# Patient Record
Sex: Female | Born: 1965 | Race: White | Hispanic: No | Marital: Married | Smoking: Never smoker
Health system: Southern US, Community
[De-identification: ages and names within clinical notes are randomized; demographics above are authoritative.]

## PROBLEM LIST (undated history)

## (undated) DIAGNOSIS — I10 Essential (primary) hypertension: Secondary | ICD-10-CM

## (undated) DIAGNOSIS — E2839 Other primary ovarian failure: Secondary | ICD-10-CM

## (undated) HISTORY — PX: TONSILLECTOMY: SUR1361

## (undated) HISTORY — DX: Other primary ovarian failure: E28.39

## (undated) HISTORY — DX: Essential (primary) hypertension: I10

---

## 2008-05-18 ENCOUNTER — Ambulatory Visit: Payer: Self-pay | Admitting: Family Medicine

## 2008-05-18 DIAGNOSIS — I10 Essential (primary) hypertension: Secondary | ICD-10-CM | POA: Insufficient documentation

## 2008-05-20 ENCOUNTER — Encounter: Payer: Self-pay | Admitting: Family Medicine

## 2008-05-23 ENCOUNTER — Encounter: Payer: Self-pay | Admitting: Family Medicine

## 2008-05-23 DIAGNOSIS — E785 Hyperlipidemia, unspecified: Secondary | ICD-10-CM

## 2008-05-23 LAB — CONVERTED CEMR LAB
Albumin: 4.6 g/dL (ref 3.5–5.2)
Alkaline Phosphatase: 88 units/L (ref 39–117)
CO2: 22 meq/L (ref 19–32)
Cholesterol, target level: 200 mg/dL
Cholesterol: 209 mg/dL — ABNORMAL HIGH (ref 0–200)
Glucose, Bld: 118 mg/dL — ABNORMAL HIGH (ref 70–99)
HDL goal, serum: 40 mg/dL
LDL Cholesterol: 122 mg/dL — ABNORMAL HIGH (ref 0–99)
LDL Goal: 160 mg/dL
Potassium: 4.1 meq/L (ref 3.5–5.3)
Sodium: 141 meq/L (ref 135–145)
Total Protein: 8.2 g/dL (ref 6.0–8.3)
Triglycerides: 210 mg/dL — ABNORMAL HIGH (ref <150)

## 2008-07-01 ENCOUNTER — Ambulatory Visit: Payer: Self-pay | Admitting: Family Medicine

## 2009-04-04 ENCOUNTER — Ambulatory Visit: Payer: Self-pay | Admitting: Family Medicine

## 2009-04-04 DIAGNOSIS — R799 Abnormal finding of blood chemistry, unspecified: Secondary | ICD-10-CM

## 2009-05-11 ENCOUNTER — Ambulatory Visit: Payer: Self-pay | Admitting: Family Medicine

## 2009-05-12 LAB — CONVERTED CEMR LAB
Chloride: 102 meq/L (ref 96–112)
Creatinine, Ser: 0.87 mg/dL (ref 0.40–1.20)
TSH: 5.107 microintl units/mL — ABNORMAL HIGH (ref 0.350–4.500)
Triglycerides: 165 mg/dL — ABNORMAL HIGH (ref <150)

## 2009-05-15 ENCOUNTER — Encounter: Payer: Self-pay | Admitting: Family Medicine

## 2009-05-15 LAB — CONVERTED CEMR LAB: Free T4: 1.07 ng/dL (ref 0.80–1.80)

## 2009-11-10 ENCOUNTER — Ambulatory Visit: Payer: Self-pay | Admitting: Family Medicine

## 2010-08-07 ENCOUNTER — Ambulatory Visit: Payer: Self-pay | Admitting: Family Medicine

## 2011-01-22 NOTE — Assessment & Plan Note (Signed)
Summary: F/u on BP meds, lipids   Vital Signs:  Patient profile:   45 year old female Height:      66.5 inches Weight:      242 pounds BMI:     38.61 Pulse rate:   87 / minute BP sitting:   107 / 66  (left arm) Cuff size:   regular  Vitals Entered By: Avon Gully CMA, (AAMA) (August 07, 2010 9:07 AM) CC: F/u BP, Hypertension Management   Primary Care Quintana Canelo:  Linford Arnold, C  CC:  F/u BP and Hypertension Management.  History of Present Illness: Has been waling for exericse. doing well. Taking her meds regularly.   Hypertension History:      She denies headache, chest pain, palpitations, dyspnea with exertion, orthopnea, PND, peripheral edema, visual symptoms, neurologic problems, syncope, and side effects from treatment.  She notes no problems with any antihypertensive medication side effects.  Further comments include: Has been watching the salt in her diet. Marland Kitchen        Positive major cardiovascular risk factors include hyperlipidemia and hypertension.  Negative major cardiovascular risk factors include female age less than 3 years old, no history of diabetes, negative family history for ischemic heart disease, and non-tobacco-user status.        Further assessment for target organ damage reveals no history of ASHD, stroke/TIA, or peripheral vascular disease.     Current Medications (verified): 1)  Lisinopril-Hydrochlorothiazide 20-25 Mg  Tabs (Lisinopril-Hydrochlorothiazide) .... Take 1 Tablet By Mouth Once A Day 2)  Metoprolol Tartrate 25 Mg Tabs (Metoprolol Tartrate) .... Take 1 Tablet By Mouth Two Times A Day 3)  Fish Oil 1000 Mg Caps (Omega-3 Fatty Acids) .... 2 Tabs A Day By Mouth  Allergies (verified): No Known Drug Allergies  Comments:  Nurse/Medical Assistant: The patient's medications and allergies were reviewed with the patient and were updated in the Medication and Allergy Lists. Avon Gully CMA, Duncan Dull) (August 07, 2010 9:08 AM)  Social  History: Reviewed history from 05/18/2008 and no changes required. 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 Married to [employer] with no children.  Never Smoked Alcohol use-no Drug use-no Regular exercise-no  Physical Exam  General:  Well-developed,well-nourished,in no acute distress; alert,appropriate and cooperative throughout examination Head:  Normocephalic and atraumatic without obvious abnormalities. No apparent alopecia or balding. Lungs:  Normal respiratory effort, chest expands symmetrically. Lungs are clear to auscultation, no crackles or wheezes. Heart:  Normal rate and regular rhythm. S1 and S2 normal without gallop, murmur, click, rub or other extra sounds. No carotid bruits.  Extremities:  NO LE edema.  Skin:  no rashes.   Cervical Nodes:  No lymphadenopathy noted Psych:  Cognition and judgment appear intact. Alert and cooperative with normal attention span and concentration. No apparent delusions, illusions, hallucinations   Impression & Recommendations:  Problem # 1:  HYPERTENSION, BENIGN (ICD-401.1) Looks great. Due for labs contnue current meds.  Her updated medication list for this problem includes:    Lisinopril-hydrochlorothiazide 20-25 Mg Tabs (Lisinopril-hydrochlorothiazide) .Marland Kitchen... Take 1 tablet by mouth once a day    Metoprolol Tartrate 25 Mg Tabs (Metoprolol tartrate) .Marland Kitchen... Take 1 tablet by mouth two times a day  BP today: 107/66 Prior BP: 133/77 (11/10/2009)  Prior 10 Yr Risk Heart Disease: 3 % (05/11/2009)  Labs Reviewed: K+: 4.5 (05/11/2009) Creat: : 0.87 (05/11/2009)   Chol: 209 (05/20/2008)   HDL: 45 (05/20/2008)   LDL: 122 (05/20/2008)   TG: 165 (05/11/2009)  Orders: T-Comprehensive  Metabolic Panel ([id number])  Problem # 2:  HYPERLIPIDEMIA (ICD-272.4) Due to recheck Encourage reg exercise and weight loss.  Orders: T-Lipid Profile ([id number])  Complete Medication List: 1)   Lisinopril-hydrochlorothiazide 20-25 Mg Tabs (Lisinopril-hydrochlorothiazide) .... Take 1 tablet by mouth once a day 2)  Metoprolol Tartrate 25 Mg Tabs (Metoprolol tartrate) .... Take 1 tablet by mouth two times a day 3)  Fish Oil 1000 Mg Caps (Omega-3 fatty acids) .... 2 tabs a day by mouth  Hypertension Assessment/Plan:      The patient's hypertensive risk group is category B: At least one risk factor (excluding diabetes) with no target organ damage.  Her calculated 10 year risk of coronary heart disease is 2 %.  Today's blood pressure is 107/66.  Her blood pressure goal is < 140/90.  Patient Instructions: 1)  We will you with your labs.  2)  Please schedule a follow-up appointment in 1 year.  Prescriptions: METOPROLOL TARTRATE 25 MG TABS (METOPROLOL TARTRATE) Take 1 tablet by mouth two times a day  #90 x 3   Entered and Authorized by:   Nani Gasser MD   Signed by:   Nani Gasser MD on 08/07/2010   Method used:   Electronically to        [facility] [phone]* (retail)       [address]       [place], [place]  [address]       Ph: [phone]       Fax: [phone]   RxID:   [id number] LISINOPRIL-HYDROCHLOROTHIAZIDE 20-25 MG  TABS (LISINOPRIL-HYDROCHLOROTHIAZIDE) Take 1 tablet by mouth once a day  #90 x 3   Entered and Authorized by:   Nani Gasser MD   Signed by:   Nani Gasser MD on 08/07/2010   Method used:   Electronically to        [facility] [phone]* (retail)       [address]       [place], [place]  [address]       Ph: [phone]       Fax: [phone]   RxID:   [id number]

## 2011-09-10 ENCOUNTER — Other Ambulatory Visit: Payer: Self-pay | Admitting: Family Medicine

## 2011-09-10 ENCOUNTER — Other Ambulatory Visit: Payer: Self-pay | Admitting: *Deleted

## 2011-09-10 MED ORDER — METOPROLOL TARTRATE 25 MG PO TABS: 25.0000 mg | ORAL_TABLET | Freq: Two times a day (BID) | ORAL | Status: DC

## 2011-09-10 MED ORDER — LISINOPRIL-HYDROCHLOROTHIAZIDE 20-25 MG PO TABS: 1.0000 | ORAL_TABLET | Freq: Every day | ORAL | Status: DC

## 2011-09-10 NOTE — Telephone Encounter (Signed)
Pt called for refills of both her BP medications. Plan:  Reviewed the pt chart and the metoprolol was already sent earlier today.  Will send the lisinopril HCTZ 20-25 for 30 day supply only.  Pt needs BP office visit since it has been over a year since seen for this problem.  Sent to [facility] for # 30/0 refills.

## 2011-09-11 ENCOUNTER — Other Ambulatory Visit: Payer: Self-pay | Admitting: *Deleted

## 2012-05-28 ENCOUNTER — Encounter: Payer: Self-pay | Admitting: *Deleted

## 2012-06-02 ENCOUNTER — Ambulatory Visit (INDEPENDENT_AMBULATORY_CARE_PROVIDER_SITE_OTHER): Payer: 59 | Admitting: Family Medicine

## 2012-06-02 VITALS — BP 118/73 | HR 84 | Wt 241.0 lb

## 2012-06-02 DIAGNOSIS — I10 Essential (primary) hypertension: Secondary | ICD-10-CM

## 2012-06-02 DIAGNOSIS — E785 Hyperlipidemia, unspecified: Secondary | ICD-10-CM

## 2012-06-02 DIAGNOSIS — R109 Unspecified abdominal pain: Secondary | ICD-10-CM

## 2012-06-02 DIAGNOSIS — E669 Obesity, unspecified: Secondary | ICD-10-CM

## 2012-06-02 LAB — CBC WITH DIFFERENTIAL/PLATELET
Basophils Absolute: 0 10*3/uL (ref 0.0–0.1)
Basophils Relative: 0 % (ref 0–1)
Eosinophils Absolute: 0.3 10*3/uL (ref 0.0–0.7)
Eosinophils Relative: 3 % (ref 0–5)
Lymphocytes Relative: 18 % (ref 12–46)
MCH: 29.3 pg (ref 26.0–34.0)
MCHC: 33.3 g/dL (ref 30.0–36.0)
MCV: 87.8 fL (ref 78.0–100.0)
Monocytes Absolute: 0.7 10*3/uL (ref 0.1–1.0)
Platelets: 301 10*3/uL (ref 150–400)
RDW: 13.3 % (ref 11.5–15.5)
WBC: 9.8 10*3/uL (ref 4.0–10.5)

## 2012-06-02 MED ORDER — METOPROLOL TARTRATE 25 MG PO TABS: 25.0000 mg | ORAL_TABLET | Freq: Two times a day (BID) | ORAL | Status: DC

## 2012-06-02 MED ORDER — LISINOPRIL-HYDROCHLOROTHIAZIDE 20-25 MG PO TABS: 1.0000 | ORAL_TABLET | Freq: Every day | ORAL | Status: DC

## 2012-06-02 NOTE — Progress Notes (Signed)
Subjective:    Patient ID: Selena Richard, female    DOB: October 29, 1966, 46 y.o.   MRN: [id number]  HPI HTN - No CP or SOB.  Taking meds regularly.  No SE on the medication. Trying to watch her salt intake.  No regulare xercise.   Abodminal pain intermitttantly. No cramping or nausea.  Did have some hemorrhoids and rectal itching and mucous. Says she felt like she constantly had to clean herself but that has resolved in the last 3 weeks. Will noticed stomach pain diffuse but more often on the right. Usually happens before eatting.  Eating makes it feels better.  No vomiting. Started about 1.5 weeks ago.  Still has gallbladder.   Hyperlipidemia-she still takes artificial daily. We have not checked her blood work since 2009.  Review of Systems BP 118/73  Pulse 84  Wt 241 lb (109.317 kg)  SpO2 99%    No Known Allergies  Past Medical History  Diagnosis Date  . Hypertension   . Primary ovarian failure     Past Surgical History  Procedure Date  . Tonsillectomy     History   Social History  . Marital Status: Married    Spouse Name: N/A    Number of Children: N/A  . Years of Education: N/A   Occupational History  . Not on file.   Social History Main Topics  . Smoking status: Never Smoker   . Smokeless tobacco: Not on file  . Alcohol Use: No  . Drug Use: No  . Sexually Active:    Other Topics Concern  . Not on file   Social History Narrative  . No narrative on file    Family History  Problem Relation Age of Onset  . Hypertension Father     Outpatient Encounter Prescriptions as of 06/02/2012  Medication Sig Dispense Refill  . fish oil-omega-3 fatty acids 1000 MG capsule Take 2 g by mouth 2 (two) times daily.      Marland Kitchen lisinopril-hydrochlorothiazide (PRINZIDE,ZESTORETIC) 20-25 MG per tablet Take 1 tablet by mouth daily.  90 tablet  1  . metoprolol tartrate (LOPRESSOR) 25 MG tablet Take 1 tablet (25 mg total) by mouth 2 (two) times daily.  180 tablet  1  . DISCONTD:  lisinopril-hydrochlorothiazide (PRINZIDE,ZESTORETIC) 20-25 MG per tablet Take 1 tablet by mouth daily.  30 tablet  0  . DISCONTD: metoprolol tartrate (LOPRESSOR) 25 MG tablet Take 1 tablet (25 mg total) by mouth 2 (two) times daily.  180 tablet  1          Objective:   Physical Exam  Constitutional: She is oriented to person, place, and time. She appears well-developed and well-nourished.       + obese   HENT:  Head: Normocephalic and atraumatic.  Eyes: Conjunctivae are normal. Pupils are equal, round, and reactive to light.  Neck: Neck supple. No thyromegaly present.  Cardiovascular: Normal rate, regular rhythm and normal heart sounds.        No carotid bruits.  No abdomina bruits.   Pulmonary/Chest: Effort normal and breath sounds normal.  Abdominal: Soft. Bowel sounds are normal. She exhibits no distension and no mass. There is no tenderness. There is no rebound and no guarding.  Musculoskeletal: She exhibits no edema.  Lymphadenopathy:    She has no cervical adenopathy.  Neurological: She is alert and oriented to person, place, and time.  Skin: Skin is warm and dry.  Psychiatric: She has a normal mood and affect. Her behavior is  normal.          Assessment & Plan:  HTN - Well controlled.  CheckCMP. RF sent. F/U in 6 months.    Abdominal pain - Consider GERD, vs ulcer. Pain is focused in the LUQ primarly but no other GB symptoms.  Will check liver and pancreas ienzymes. If labs are normal then rec trial ofPrilosec OTC for 2 weeks to see if helps or not.    Hyperlipidemia - REcheck lipid today.   Lab Results  Component Value Date   CHOL 209* 05/20/2008   HDL 45 05/20/2008   LDLCALC 122* 05/20/2008   TRIG 165* 05/11/2009   CHOLHDL 4.6 Ratio 05/20/2008   Obese - Discussed getting regular exercise and working on diet.

## 2012-06-03 LAB — COMPLETE METABOLIC PANEL WITH GFR
Alkaline Phosphatase: 83 U/L (ref 39–117)
BUN: 20 mg/dL (ref 6–23)
GFR, Est Non African American: 83 mL/min
Glucose, Bld: 107 mg/dL — ABNORMAL HIGH (ref 70–99)
Total Bilirubin: 0.5 mg/dL (ref 0.3–1.2)

## 2012-06-03 LAB — LIPASE: Lipase: 24 U/L (ref 0–75)

## 2012-06-03 LAB — LIPID PANEL
Cholesterol: 206 mg/dL — ABNORMAL HIGH (ref 0–200)
HDL: 41 mg/dL (ref >39)
Total CHOL/HDL Ratio: 5 Ratio

## 2013-02-22 ENCOUNTER — Encounter: Payer: Self-pay | Admitting: Family Medicine

## 2013-02-22 ENCOUNTER — Ambulatory Visit (INDEPENDENT_AMBULATORY_CARE_PROVIDER_SITE_OTHER): Payer: 59 | Admitting: Family Medicine

## 2013-02-22 VITALS — BP 133/80 | HR 76 | Ht 66.0 in | Wt 238.0 lb

## 2013-02-22 DIAGNOSIS — I10 Essential (primary) hypertension: Secondary | ICD-10-CM

## 2013-02-22 DIAGNOSIS — R7301 Impaired fasting glucose: Secondary | ICD-10-CM

## 2013-02-22 DIAGNOSIS — E785 Hyperlipidemia, unspecified: Secondary | ICD-10-CM

## 2013-02-22 DIAGNOSIS — R7302 Impaired glucose tolerance (oral): Secondary | ICD-10-CM

## 2013-02-22 MED ORDER — LISINOPRIL-HYDROCHLOROTHIAZIDE 20-25 MG PO TABS: 1.0000 | ORAL_TABLET | Freq: Every day | ORAL | Status: DC

## 2013-02-22 MED ORDER — METOPROLOL TARTRATE 25 MG PO TABS: 25.0000 mg | ORAL_TABLET | Freq: Two times a day (BID) | ORAL | Status: DC

## 2013-02-22 NOTE — Patient Instructions (Signed)

## 2013-02-22 NOTE — Progress Notes (Signed)
  Subjective:    Patient ID: Selena Richard, female    DOB: [birth year], 47 y.o.   MRN: [id number]  HPI HTN -  Pt denies chest pain, SOB, dizziness, or heart palpitations.  Taking meds as directed w/o problems.  Denies medication side effects. Some exercise.    Abnormal glucose - Due to check A1C today for f/u. Sugar has been borderline the last 3 times.  She denies any polyuria or polydipsia.  Hyperlipidemia-she has been taking her fish oil. No side effects or problems.  Review of Systems     Objective:   Physical Exam  Constitutional: She is oriented to person, place, and time. She appears well-developed and well-nourished.  HENT:  Head: Normocephalic and atraumatic.  Eyes: Conjunctivae and EOM are normal. Pupils are equal, round, and reactive to light.  Neck: Neck supple. No thyromegaly present.  Cardiovascular: Normal rate, regular rhythm and normal heart sounds.   Pulmonary/Chest: Effort normal and breath sounds normal. She has no wheezes.  Lymphadenopathy:    She has no cervical adenopathy.  Neurological: She is alert and oriented to person, place, and time.  Skin: Skin is warm and dry.  Psychiatric: She has a normal mood and affect.          Assessment & Plan:  HTN - Well controlled.  Reminded her about low sat diet.  Work on regular exercise. Followup in 6 months.  IFG - A1C today 5.7.  Thus she definition for impaired fasting glucose.  Discussed with this means exactly and what the diagnosis in details.Discussed working on avoiding concentrated sweets and eating a low carb diet. Work on trying to strike a regular exercise, 30 minutes 5 days per week. Follow up in 6 months to recheck.   hyperlpidemia -  Due to recheck levels.  Given labslip today.

## 2013-11-03 ENCOUNTER — Other Ambulatory Visit: Payer: Self-pay | Admitting: Family Medicine

## 2013-11-09 ENCOUNTER — Ambulatory Visit (INDEPENDENT_AMBULATORY_CARE_PROVIDER_SITE_OTHER): Payer: 59 | Admitting: Family Medicine

## 2013-11-09 ENCOUNTER — Encounter: Payer: Self-pay | Admitting: Family Medicine

## 2013-11-09 VITALS — BP 117/74 | HR 70 | Temp 96.7°F | Wt 230.0 lb

## 2013-11-09 DIAGNOSIS — I10 Essential (primary) hypertension: Secondary | ICD-10-CM

## 2013-11-09 DIAGNOSIS — E785 Hyperlipidemia, unspecified: Secondary | ICD-10-CM

## 2013-11-09 DIAGNOSIS — R7301 Impaired fasting glucose: Secondary | ICD-10-CM

## 2013-11-09 LAB — POCT GLYCOSYLATED HEMOGLOBIN (HGB A1C): Hemoglobin A1C: 5.6

## 2013-11-09 MED ORDER — METOPROLOL TARTRATE 25 MG PO TABS: 25.0000 mg | ORAL_TABLET | Freq: Two times a day (BID) | ORAL | Status: DC

## 2013-11-09 MED ORDER — LISINOPRIL-HYDROCHLOROTHIAZIDE 20-25 MG PO TABS: ORAL_TABLET | ORAL | Status: DC

## 2013-11-09 NOTE — Progress Notes (Signed)
  Subjective:    Patient ID: Selena Richard, female    DOB: June 03, 1966, 47 y.o.   MRN: [id number]  HPI HTN-  Pt denies chest pain, SOB, dizziness, or heart palpitations.  Taking meds as directed w/o problems.  Denies medication side effects.     IFG-  No inc thirst or urination. Has lost 8 lbs.   Lab Results  Component Value Date   HGBA1C 5.7 02/22/2013    Hyperlipidemia- Due for lipids.  No other supplements.    Review of Systems \ BP 117/74  Pulse 70  Temp(Src) 96.7 F (35.9 C)  Wt 230 lb (104.327 kg)    No Known Allergies  Past Medical History  Diagnosis Date  . Hypertension   . Primary ovarian failure     Past Surgical History  Procedure Laterality Date  . Tonsillectomy      History   Social History  . Marital Status: Married    Spouse Name: N/A    Number of Children: N/A  . Years of Education: N/A   Occupational History  . Not on file.   Social History Main Topics  . Smoking status: Never Smoker   . Smokeless tobacco: Not on file  . Alcohol Use: No  . Drug Use: No  . Sexual Activity:    Other Topics Concern  . Not on file   Social History Narrative  . No narrative on file    Family History  Problem Relation Age of Onset  . Hypertension Father     Outpatient Encounter Prescriptions as of 11/09/2013  Medication Sig  . lisinopril-hydrochlorothiazide (PRINZIDE,ZESTORETIC) 20-25 MG per tablet Take one tablet by mouth one time daily  . metoprolol tartrate (LOPRESSOR) 25 MG tablet Take 1 tablet (25 mg total) by mouth 2 (two) times daily.  . [DISCONTINUED] lisinopril-hydrochlorothiazide (PRINZIDE,ZESTORETIC) 20-25 MG per tablet Take one tablet by mouth one time daily  . [DISCONTINUED] metoprolol tartrate (LOPRESSOR) 25 MG tablet Take 1 tablet (25 mg total) by mouth 2 (two) times daily.  . [DISCONTINUED] fish oil-omega-3 fatty acids 1000 MG capsule Take 2 g by mouth 2 (two) times daily.       Objective:   Physical Exam  Constitutional: She is  oriented to person, place, and time. She appears well-developed and well-nourished.  HENT:  Head: Normocephalic and atraumatic.  Cardiovascular: Normal rate, regular rhythm and normal heart sounds.   Pulmonary/Chest: Effort normal and breath sounds normal.  Musculoskeletal: She exhibits no edema.  Neurological: She is alert and oriented to person, place, and time.  Skin: Skin is warm and dry.  Psychiatric: She has a normal mood and affect. Her behavior is normal.          Assessment & Plan:  HTN- Well controlled. F/U in 6 months.   IFG - Great jobs A1C is down to 5.6  F/U in 6 months.    Hyperlipidemia - Due to check today. Lipids ordered.    Declined flu vaccine.

## 2013-11-09 NOTE — Addendum Note (Signed)
Addended by: Deno Etienne on: 11/09/2013 08:49 AM   Modules accepted: Orders

## 2013-11-09 NOTE — Patient Instructions (Signed)
Keep up the good work!!!!   Keep up a regular exercise program and make sure you are eating a healthy diet Try to eat 4 servings of dairy a day, or if you are lactose intolerant take a calcium with vitamin D daily.  Your vaccines are up to date.

## 2015-01-27 ENCOUNTER — Ambulatory Visit (INDEPENDENT_AMBULATORY_CARE_PROVIDER_SITE_OTHER): Payer: 59 | Admitting: Family Medicine

## 2015-01-27 ENCOUNTER — Encounter: Payer: Self-pay | Admitting: Family Medicine

## 2015-01-27 VITALS — BP 107/67 | HR 77 | Ht 66.0 in | Wt 240.0 lb

## 2015-01-27 DIAGNOSIS — E785 Hyperlipidemia, unspecified: Secondary | ICD-10-CM

## 2015-01-27 DIAGNOSIS — Z114 Encounter for screening for human immunodeficiency virus [HIV]: Secondary | ICD-10-CM

## 2015-01-27 DIAGNOSIS — I10 Essential (primary) hypertension: Secondary | ICD-10-CM

## 2015-01-27 DIAGNOSIS — R7301 Impaired fasting glucose: Secondary | ICD-10-CM

## 2015-01-27 DIAGNOSIS — Z6838 Body mass index (BMI) 38.0-38.9, adult: Secondary | ICD-10-CM

## 2015-01-27 LAB — POCT GLYCOSYLATED HEMOGLOBIN (HGB A1C): HEMOGLOBIN A1C: 5.8

## 2015-01-27 MED ORDER — LISINOPRIL-HYDROCHLOROTHIAZIDE 20-25 MG PO TABS: ORAL_TABLET | ORAL | Status: DC

## 2015-01-27 MED ORDER — METOPROLOL TARTRATE 25 MG PO TABS: 25.0000 mg | ORAL_TABLET | Freq: Two times a day (BID) | ORAL | Status: DC

## 2015-01-27 NOTE — Patient Instructions (Signed)
Liraglutide injection (Weight Management) What is this medicine? LIRAGLUTIDE (LIR a GLOO tide) is used with a reduced calorie diet and exercise to help you lose weight. This medicine may be used for other purposes; ask your health care provider or pharmacist if you have questions. COMMON BRAND NAME(S): Saxenda What should I tell my health care provider before I take this medicine? They need to know if you have any of these conditions: -endocrine tumors (MEN 2) or if someone in your family had these tumors -gallstones -high cholesterol -history of alcohol abuse problem -history of pancreatitis -kidney disease or if you are on dialysis -liver disease -previous swelling of the tongue, face, or lips with difficulty breathing, difficulty swallowing, hoarseness, or tightening of the throat -stomach problems -suicidal thoughts, plans, or attempt; a previous suicide attempt by you or a family member -thyroid cancer or if someone in your family had thyroid cancer -an unusual or allergic reaction to liraglutide, medicines, foods, dyes, or preservatives -pregnant or trying to get pregnant -breast-feeding How should I use this medicine? This medicine is for injection under the skin of your upper leg, stomach area, or upper arm. You will be taught how to prepare and give this medicine. Use exactly as directed. Take your medicine at regular intervals. Do not take it more often than directed. It is important that you put your used needles and syringes in a special sharps container. Do not put them in a trash can. If you do not have a sharps container, call your pharmacist or healthcare provider to get one. A special MedGuide will be given to you by the pharmacist with each prescription and refill. Be sure to read this information carefully each time. Talk to your pediatrician regarding the use of this medicine in children. Special care may be needed. Overdosage: If you think you've taken too much of this  medicine contact a poison control center or emergency room at once. Overdosage: If you think you have taken too much of this medicine contact a poison control center or emergency room at once. NOTE: This medicine is only for you. Do not share this medicine with others. What if I miss a dose? If you miss a dose, take it as soon as you can. If it is almost time for your next dose, take only that dose. Do not take double or extra doses. If you miss your dose for 3 days or more, call your doctor or health care professional to talk about how to restart this medicine. What may interact with this medicine? -acetaminophen -atorvastatin -birth control pills -digoxin -griseofulvin -lisinopril This list may not describe all possible interactions. Give your health care provider a list of all the medicines, herbs, non-prescription drugs, or dietary supplements you use. Also tell them if you smoke, drink alcohol, or use illegal drugs. Some items may interact with your medicine. What should I watch for while using this medicine? Visit your doctor or health care professional for regular checks on your progress. This medicine is intended to be used in addition to a healthy diet and appropriate exercise. The best results are achieved this way. Do not increase or in any way change your dose without consulting your doctor or health care professional. This medicine may affect blood sugar levels. If you have diabetes, check with your doctor or health care professional before you change your diet or the dose of your diabetic medicine. Patients and their families should watch out for worsening depression or thoughts of suicide. Also watch   out for sudden changes in feelings such as feeling anxious, agitated, panicky, irritable, hostile, aggressive, impulsive, severely restless, overly excited and hyperactive, or not being able to sleep. If this happens, especially at the beginning of treatment or after a change in dose, call  your health care professional. What side effects may I notice from receiving this medicine? Side effects that you should report to your doctor or health care professional as soon as possible: -allergic reactions like skin rash, itching or hives, swelling of the face, lips, or tongue -breathing problems -fever, chills -loss of appetite -signs and symptoms of low blood sugar such as feeling anxious, confusion, dizziness, increased hunger, unusually weak or tired, sweating, shakiness, cold, irritable, headache, blurred vision, fast heartbeat, loss of consciousness -trouble passing urine or change in the amount of urine -unusual stomach pain or upset -vomiting Side effects that usually do not require medical attention (Report these to your doctor or health care professional if they continue or are bothersome.): -constipation -diarrhea -fatigue -headache -nausea This list may not describe all possible side effects. Call your doctor for medical advice about side effects. You may report side effects to FDA at 1-800-FDA-1088. Where should I keep my medicine? Keep out of the reach of children. Store unopened pen in a refrigerator between 2 and 8 degrees C (36 and 46 degrees F). Do not freeze or use if the medicine has been frozen. Protect from light and excessive heat. After you first use the pen, it can be stored at room temperature between 15 and 30 degrees C (59 and 86 degrees F) or in a refrigerator. Throw away your used pen after 30 days or after the expiration date, whichever comes first. Do not store your pen with the needle attached. If the needle is left on, medicine may leak from the pen. NOTE: This sheet is a summary. It may not cover all possible information. If you have questions about this medicine, talk to your doctor, pharmacist, or health care provider.  2015, Elsevier/Gold Standard. (2014-02-03 12:29:49)  

## 2015-01-27 NOTE — Progress Notes (Signed)
   Subjective:    Patient ID: Selena NewcomerPatricia A Richard, female    DOB: 06/02/1966, 49 y.o.   MRN: [id number]  HPI Hypertension- Pt denies chest pain, SOB, dizziness, or heart palpitations.  Taking meds as directed w/o problems.  Denies medication side effects.    IFG - She says not change in her diet or exercise. No increased thirst or urination.   Review of Systems     Objective:   Physical Exam  Constitutional: She is oriented to person, place, and time. She appears well-developed and well-nourished.  HENT:  Head: Normocephalic and atraumatic.  Cardiovascular: Normal rate, regular rhythm and normal heart sounds.   Pulmonary/Chest: Effort normal and breath sounds normal.  Neurological: She is alert and oriented to person, place, and time.  Skin: Skin is warm and dry.  Psychiatric: She has a normal mood and affect. Her behavior is normal.          Assessment & Plan:  HTN - well controlled. F/U in 6 months.    IFG - A1C is 5.8 today.  A1c is up from previous. Discussed importance of diet and exercise and getting back on track.  BMI 38 - we discussed options including metformin versus Sexenda. I think with her impaired fasting glucose one of these agents would be very helpful for her to help lower her glucose as well as help reduce her weight. She has gained some weight since I last saw her. She wants to really work on diet and exercise on her own over the next 3 months. And then think about the other options as well.   Follow-up in 6 months.

## 2015-05-01 ENCOUNTER — Ambulatory Visit (INDEPENDENT_AMBULATORY_CARE_PROVIDER_SITE_OTHER): Payer: 59 | Admitting: Family Medicine

## 2015-05-01 ENCOUNTER — Encounter: Payer: Self-pay | Admitting: Family Medicine

## 2015-05-01 VITALS — BP 118/79 | HR 74 | Wt 238.0 lb

## 2015-05-01 DIAGNOSIS — Q969 Turner's syndrome, unspecified: Secondary | ICD-10-CM

## 2015-05-01 DIAGNOSIS — R7301 Impaired fasting glucose: Secondary | ICD-10-CM

## 2015-05-01 DIAGNOSIS — Z6838 Body mass index (BMI) 38.0-38.9, adult: Secondary | ICD-10-CM

## 2015-05-01 LAB — POCT GLYCOSYLATED HEMOGLOBIN (HGB A1C): Hemoglobin A1C: 5.9

## 2015-05-01 NOTE — Patient Instructions (Signed)
Corcidan and Mucinex.

## 2015-05-01 NOTE — Progress Notes (Signed)
   Subjective:    Patient ID: Selena Richard, female    DOB: 03/22/1966, 49 y.o.   MRN: [id number]  HPI Here for f/u IFG.  She had also gained some weight last time she was here. Has lot 2 lbs since last here. Has been watching dietary choices more.  Cut back on sugars and carbs. No wounds that aren't healing.    [place]  Her last pap was 7 years ago. Would like to see gyn and get estab.    Review of Systems     Objective:   Physical Exam  Constitutional: She is oriented to person, place, and time. She appears well-developed and well-nourished.  HENT:  Head: Normocephalic and atraumatic.  Cardiovascular: Normal rate, regular rhythm and normal heart sounds.   Pulmonary/Chest: Effort normal and breath sounds normal.  Neurological: She is alert and oriented to person, place, and time.  Skin: Skin is warm and dry.  Psychiatric: She has a normal mood and affect. Her behavior is normal.          Assessment & Plan:  IFG - repeat A1C is 5.9.  Has made some good changes with lifestyle.    BMI 38. Has lost 2 lb. Continue to work on weight loss.   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

## 2015-09-01 ENCOUNTER — Ambulatory Visit: Payer: 59 | Admitting: Family Medicine

## 2015-09-04 ENCOUNTER — Ambulatory Visit (INDEPENDENT_AMBULATORY_CARE_PROVIDER_SITE_OTHER): Payer: 59 | Admitting: Family Medicine

## 2015-09-04 ENCOUNTER — Encounter: Payer: Self-pay | Admitting: Family Medicine

## 2015-09-04 VITALS — BP 127/75 | HR 70 | Wt 241.0 lb

## 2015-09-04 DIAGNOSIS — Z6838 Body mass index (BMI) 38.0-38.9, adult: Secondary | ICD-10-CM

## 2015-09-04 DIAGNOSIS — R7301 Impaired fasting glucose: Secondary | ICD-10-CM | POA: Diagnosis not present

## 2015-09-04 DIAGNOSIS — E785 Hyperlipidemia, unspecified: Secondary | ICD-10-CM | POA: Diagnosis not present

## 2015-09-04 DIAGNOSIS — I10 Essential (primary) hypertension: Secondary | ICD-10-CM

## 2015-09-04 DIAGNOSIS — Z114 Encounter for screening for human immunodeficiency virus [HIV]: Secondary | ICD-10-CM | POA: Diagnosis not present

## 2015-09-04 LAB — POCT GLYCOSYLATED HEMOGLOBIN (HGB A1C): HEMOGLOBIN A1C: 5.8

## 2015-09-04 MED ORDER — METOPROLOL TARTRATE 25 MG PO TABS: 25.0000 mg | ORAL_TABLET | Freq: Two times a day (BID) | ORAL | Status: DC

## 2015-09-04 MED ORDER — LISINOPRIL-HYDROCHLOROTHIAZIDE 20-25 MG PO TABS: ORAL_TABLET | ORAL | Status: DC

## 2015-09-04 NOTE — Progress Notes (Signed)
   Subjective:    Patient ID: Selena Richard, female    DOB: 05/23/1966, 49 y.o.   MRN: [id number]  HPI Hypertension- Pt denies chest pain, SOB, dizziness, or heart palpitations.  Taking meds as directed w/o problems.  Denies medication side effects.  She has gained about 3 lbs.    IFG - No regular exercise.  No inc thrist or urination.    Review of Systems     Objective:   Physical Exam  Constitutional: She is oriented to person, place, and time. She appears well-developed and well-nourished.  HENT:  Head: Normocephalic and atraumatic.  Cardiovascular: Normal rate, regular rhythm and normal heart sounds.   Pulmonary/Chest: Effort normal and breath sounds normal.  Neurological: She is alert and oriented to person, place, and time.  Skin: Skin is warm and dry.  Psychiatric: She has a normal mood and affect. Her behavior is normal.        Assessment & Plan:  HTN - well controlled. Due for CMP and lipids.  F/u in 6 months.   IFG - well controlled at 5.8.    BMI 38 - has not been exercising. Has gained a few pounds back. Discussed getting more active.

## 2016-07-12 ENCOUNTER — Other Ambulatory Visit: Payer: Self-pay | Admitting: Family Medicine

## 2016-07-17 ENCOUNTER — Telehealth: Payer: Self-pay | Admitting: Family Medicine

## 2016-07-17 NOTE — Telephone Encounter (Signed)
I called Selena Richard to inform her she needed to make a follow up appointment for her BP and IFG refills. She scheduled for 8/7 at 10:00 am. - CF

## 2016-07-29 ENCOUNTER — Ambulatory Visit: Payer: 59 | Admitting: Family Medicine

## 2016-08-12 ENCOUNTER — Other Ambulatory Visit: Payer: Self-pay | Admitting: Family Medicine

## 2016-08-12 ENCOUNTER — Ambulatory Visit: Payer: 59 | Admitting: Family Medicine

## 2016-08-19 ENCOUNTER — Ambulatory Visit: Payer: 59 | Admitting: Family Medicine

## 2016-10-24 ENCOUNTER — Ambulatory Visit (INDEPENDENT_AMBULATORY_CARE_PROVIDER_SITE_OTHER): Payer: Managed Care, Other (non HMO) | Admitting: Family Medicine

## 2016-10-24 ENCOUNTER — Encounter: Payer: Self-pay | Admitting: Family Medicine

## 2016-10-24 VITALS — BP 113/64 | HR 65 | Wt 237.0 lb

## 2016-10-24 DIAGNOSIS — E785 Hyperlipidemia, unspecified: Secondary | ICD-10-CM | POA: Diagnosis not present

## 2016-10-24 DIAGNOSIS — I10 Essential (primary) hypertension: Secondary | ICD-10-CM | POA: Diagnosis not present

## 2016-10-24 DIAGNOSIS — R7301 Impaired fasting glucose: Secondary | ICD-10-CM

## 2016-10-24 DIAGNOSIS — Z1231 Encounter for screening mammogram for malignant neoplasm of breast: Secondary | ICD-10-CM | POA: Diagnosis not present

## 2016-10-24 LAB — POCT GLYCOSYLATED HEMOGLOBIN (HGB A1C): Hemoglobin A1C: 5.7

## 2016-10-24 MED ORDER — LISINOPRIL-HYDROCHLOROTHIAZIDE 20-25 MG PO TABS: 1.0000 | ORAL_TABLET | Freq: Every day | ORAL | 1 refills | Status: DC

## 2016-10-24 MED ORDER — METOPROLOL TARTRATE 25 MG PO TABS: ORAL_TABLET | ORAL | 1 refills | Status: DC

## 2016-10-24 NOTE — Progress Notes (Signed)
Subjective:    CC:   HPI: Hypertension- Pt denies chest pain, SOB, dizziness, or heart palpitations.  Taking meds as directed w/o problems.  Denies medication side effects.    IFG -  No inc thirst or urination.   Past medical history, Surgical history, Family history not pertinant except as noted below, Social history, Allergies, and medications have been entered into the medical record, reviewed, and corrections made.   Review of Systems: No fevers, chills, night sweats, weight loss, chest pain, or shortness of breath.   Objective:    General: Well Developed, well nourished, and in no acute distress.  Neuro: Alert and oriented x3, extra-ocular muscles intact, sensation grossly intact.  HEENT: Normocephalic, atraumatic  Skin: Warm and dry, no rashes. Cardiac: Regular rate and rhythm, no murmurs rubs or gallops, no lower extremity edema.  Respiratory: Clear to auscultation bilaterally. Not using accessory muscles, speaking in full sentences.   Impression and Recommendations:    HTN - Well controlled. Continue current regimen. Follow up in  6months.  Due for CMP and lipids.   IFG - stable.  A1C of 5.7.    Discussed need for Pap smear and mammogram. Will place order for her mammograms that she can have an downstairs. Encouraged her to schedule her physical with Pap smear as soon as possible. She is 8 years overdue.

## 2016-10-25 LAB — COMPLETE METABOLIC PANEL WITH GFR
ALBUMIN: 4 g/dL (ref 3.6–5.1)
ALK PHOS: 81 U/L (ref 33–115)
ALT: 29 U/L (ref 6–29)
AST: 22 U/L (ref 10–35)
BUN: 17 mg/dL (ref 7–25)
CO2: 21 mmol/L (ref 20–31)
Calcium: 9.5 mg/dL (ref 8.6–10.2)
Chloride: 103 mmol/L (ref 98–110)
Creat: 0.92 mg/dL (ref 0.50–1.10)
GFR, Est African American: 85 mL/min (ref >=60)
GFR, Est Non African American: 74 mL/min (ref >=60)
GLUCOSE: 104 mg/dL — AB (ref 65–99)
POTASSIUM: 4.4 mmol/L (ref 3.5–5.3)
SODIUM: 137 mmol/L (ref 135–146)
Total Bilirubin: 0.5 mg/dL (ref 0.2–1.2)
Total Protein: 7.3 g/dL (ref 6.1–8.1)

## 2016-10-25 LAB — LIPID PANEL
CHOL/HDL RATIO: 5.6 ratio — AB (ref <=5.0)
Cholesterol: 195 mg/dL (ref 125–200)
HDL: 35 mg/dL — ABNORMAL LOW (ref >=46)
LDL CALC: 117 mg/dL (ref <130)
Triglycerides: 213 mg/dL — ABNORMAL HIGH (ref <150)
VLDL: 43 mg/dL — AB (ref <30)

## 2017-01-23 ENCOUNTER — Other Ambulatory Visit: Payer: Self-pay | Admitting: Family Medicine

## 2017-03-21 ENCOUNTER — Other Ambulatory Visit: Payer: Self-pay | Admitting: Family Medicine

## 2017-04-23 ENCOUNTER — Encounter: Payer: 59 | Admitting: Family Medicine

## 2017-04-25 ENCOUNTER — Other Ambulatory Visit: Payer: Self-pay | Admitting: Family Medicine

## 2017-05-23 ENCOUNTER — Other Ambulatory Visit: Payer: Self-pay | Admitting: Family Medicine

## 2017-06-03 ENCOUNTER — Telehealth: Payer: Self-pay | Admitting: Family Medicine

## 2017-06-03 NOTE — Telephone Encounter (Signed)
Left a message for patient to call and schedule a BP f/u appt with Dr.Metheney

## 2017-07-28 ENCOUNTER — Other Ambulatory Visit: Payer: Self-pay | Admitting: Family Medicine

## 2017-08-21 ENCOUNTER — Ambulatory Visit (INDEPENDENT_AMBULATORY_CARE_PROVIDER_SITE_OTHER): Payer: 59 | Admitting: Family Medicine

## 2017-08-21 ENCOUNTER — Encounter: Payer: Self-pay | Admitting: Family Medicine

## 2017-08-21 VITALS — BP 120/62 | HR 67 | Wt 242.0 lb

## 2017-08-21 DIAGNOSIS — I10 Essential (primary) hypertension: Secondary | ICD-10-CM | POA: Diagnosis not present

## 2017-08-21 DIAGNOSIS — Z1231 Encounter for screening mammogram for malignant neoplasm of breast: Secondary | ICD-10-CM

## 2017-08-21 DIAGNOSIS — R7301 Impaired fasting glucose: Secondary | ICD-10-CM

## 2017-08-21 LAB — POCT GLYCOSYLATED HEMOGLOBIN (HGB A1C): Hemoglobin A1C: 5.8

## 2017-08-21 MED ORDER — LISINOPRIL-HYDROCHLOROTHIAZIDE 20-25 MG PO TABS: 1.0000 | ORAL_TABLET | Freq: Every day | ORAL | 1 refills | Status: DC

## 2017-08-21 MED ORDER — METOPROLOL TARTRATE 25 MG PO TABS: ORAL_TABLET | ORAL | 1 refills | Status: DC

## 2017-08-21 NOTE — Patient Instructions (Signed)
Please schedule your Pap smear. You can either do that with us or with her GYN group down the hall.

## 2017-08-21 NOTE — Progress Notes (Signed)
Subjective:    CC: BP, IFG   HPI:  Hypertension- Pt denies chest pain, SOB, dizziness, or heart palpitations.  Taking meds as directed w/o problems.  Denies medication side effects.    Impaired fasting glucose-no increased thirst or urination. No symptoms consistent with hypoglycemia.   Past medical history, Surgical history, Family history not pertinant except as noted below, Social history, Allergies, and medications have been entered into the medical record, reviewed, and corrections made.   Review of Systems: No fevers, chills, night sweats, weight loss, chest pain, or shortness of breath.   Objective:    General: Well Developed, well nourished, and in no acute distress.  Neuro: Alert and oriented x3, extra-ocular muscles intact, sensation grossly intact.  HEENT: Normocephalic, atraumatic  Skin: Warm and dry, no rashes. Cardiac: Regular rate and rhythm, no murmurs rubs or gallops, no lower extremity edema.  Respiratory: Clear to auscultation bilaterally. Not using accessory muscles, speaking in full sentences.   Impression and Recommendations:    HTN  - Well controlled. Continue current regimen. Follow up in  6 months. Due for CMP and lipid panel in November.  IFG - Well controlled. A1c 5.8 today.Continue current regimen. Follow up in  6 months.  Lab Results  Component Value Date   HGBA1C 5.8 08/21/2017     She is overdue for her Pap smear.encouraged her to schedule physical with Pap this fall.  Ordered her mammogram as well.

## 2017-09-04 ENCOUNTER — Other Ambulatory Visit: Payer: Self-pay | Admitting: Family Medicine

## 2018-02-19 ENCOUNTER — Ambulatory Visit: Payer: 59 | Admitting: Family Medicine

## 2018-03-05 ENCOUNTER — Ambulatory Visit: Payer: 59 | Admitting: Family Medicine

## 2018-04-11 ENCOUNTER — Other Ambulatory Visit: Payer: Self-pay | Admitting: Family Medicine

## 2018-08-13 ENCOUNTER — Encounter: Payer: Self-pay | Admitting: Family Medicine

## 2018-08-13 ENCOUNTER — Ambulatory Visit (INDEPENDENT_AMBULATORY_CARE_PROVIDER_SITE_OTHER): Payer: 59 | Admitting: Family Medicine

## 2018-08-13 VITALS — BP 125/66 | HR 66 | Ht 66.0 in | Wt 241.0 lb

## 2018-08-13 DIAGNOSIS — Z1231 Encounter for screening mammogram for malignant neoplasm of breast: Secondary | ICD-10-CM

## 2018-08-13 DIAGNOSIS — R7301 Impaired fasting glucose: Secondary | ICD-10-CM

## 2018-08-13 DIAGNOSIS — I10 Essential (primary) hypertension: Secondary | ICD-10-CM

## 2018-08-13 LAB — POCT GLYCOSYLATED HEMOGLOBIN (HGB A1C): Hemoglobin A1C: 5.7 % — AB (ref 4.0–5.6)

## 2018-08-13 MED ORDER — METOPROLOL TARTRATE 25 MG PO TABS: 25.0000 mg | ORAL_TABLET | Freq: Two times a day (BID) | ORAL | 2 refills | Status: DC

## 2018-08-13 MED ORDER — LISINOPRIL-HYDROCHLOROTHIAZIDE 20-25 MG PO TABS: 1.0000 | ORAL_TABLET | Freq: Every day | ORAL | 2 refills | Status: DC

## 2018-08-13 NOTE — Progress Notes (Signed)
Subjective:    CC: BP and glucose.    HPI:  Hypertension- Pt denies chest pain, SOB, dizziness, or heart palpitations.  Taking meds as directed w/o problems.  Denies medication side effects.    Impaired fasting glucose-no increased thirst or urination. No symptoms consistent with hypoglycemia.   Past medical history, Surgical history, Family history not pertinant except as noted below, Social history, Allergies, and medications have been entered into the medical record, reviewed, and corrections made.   Review of Systems: No fevers, chills, night sweats, weight loss, chest pain, or shortness of breath.   Objective:    General: Well Developed, well nourished, and in no acute distress.  Neuro: Alert and oriented x3, extra-ocular muscles intact, sensation grossly intact.  HEENT: Normocephalic, atraumatic  Skin: Warm and dry, no rashes. Cardiac: Regular rate and rhythm, no murmurs rubs or gallops, no lower extremity edema.  Respiratory: Clear to auscultation bilaterally. Not using accessory muscles, speaking in full sentences.   Impression and Recommendations:    HTN - Well controlled. Continue current regimen. Follow up in  6 months.     IFG - Well controlled. Continue current regimen. Follow up in  6 months.    Encouraged to schedule her mammogram. New order placed again.    Dicussed colon cancer screening.

## 2018-08-27 ENCOUNTER — Ambulatory Visit (INDEPENDENT_AMBULATORY_CARE_PROVIDER_SITE_OTHER): Payer: 59

## 2018-08-27 DIAGNOSIS — Z1231 Encounter for screening mammogram for malignant neoplasm of breast: Secondary | ICD-10-CM | POA: Diagnosis not present

## 2018-08-27 LAB — COMPLETE METABOLIC PANEL WITH GFR
AG Ratio: 1.4 (calc) (ref 1.0–2.5)
ALKALINE PHOSPHATASE (APISO): 88 U/L (ref 33–130)
ALT: 31 U/L — AB (ref 6–29)
AST: 23 U/L (ref 10–35)
Albumin: 4.2 g/dL (ref 3.6–5.1)
BILIRUBIN TOTAL: 0.6 mg/dL (ref 0.2–1.2)
BUN: 17 mg/dL (ref 7–25)
CHLORIDE: 106 mmol/L (ref 98–110)
CO2: 23 mmol/L (ref 20–32)
CREATININE: 0.91 mg/dL (ref 0.50–1.05)
Calcium: 9.3 mg/dL (ref 8.6–10.4)
GFR, Est African American: 85 mL/min/{1.73_m2} (ref >=60)
GFR, Est Non African American: 74 mL/min/{1.73_m2} (ref >=60)
GLUCOSE: 128 mg/dL (ref 65–139)
Globulin: 2.9 g/dL (calc) (ref 1.9–3.7)
Potassium: 4.1 mmol/L (ref 3.5–5.3)
Sodium: 140 mmol/L (ref 135–146)
Total Protein: 7.1 g/dL (ref 6.1–8.1)

## 2018-08-27 LAB — LIPID PANEL
CHOLESTEROL: 200 mg/dL — AB (ref <200)
HDL: 39 mg/dL — ABNORMAL LOW (ref >50)
LDL CHOLESTEROL (CALC): 130 mg/dL — AB
Non-HDL Cholesterol (Calc): 161 mg/dL (calc) — ABNORMAL HIGH (ref <130)
TRIGLYCERIDES: 177 mg/dL — AB (ref <150)
Total CHOL/HDL Ratio: 5.1 (calc) — ABNORMAL HIGH (ref <5.0)

## 2018-08-28 ENCOUNTER — Other Ambulatory Visit: Payer: Self-pay | Admitting: *Deleted

## 2018-08-28 DIAGNOSIS — R748 Abnormal levels of other serum enzymes: Secondary | ICD-10-CM

## 2019-05-13 ENCOUNTER — Ambulatory Visit: Payer: 59 | Admitting: Family Medicine

## 2019-05-20 ENCOUNTER — Other Ambulatory Visit: Payer: Self-pay | Admitting: Family Medicine

## 2019-06-23 ENCOUNTER — Other Ambulatory Visit: Payer: Self-pay | Admitting: Family Medicine

## 2019-07-23 ENCOUNTER — Other Ambulatory Visit: Payer: Self-pay | Admitting: Family Medicine

## 2019-08-05 ENCOUNTER — Encounter: Payer: Self-pay | Admitting: Family Medicine

## 2019-08-05 ENCOUNTER — Ambulatory Visit (INDEPENDENT_AMBULATORY_CARE_PROVIDER_SITE_OTHER): Payer: 59 | Admitting: Family Medicine

## 2019-08-05 ENCOUNTER — Other Ambulatory Visit: Payer: Self-pay

## 2019-08-05 VITALS — BP 135/68 | HR 75 | Ht 66.0 in | Wt 240.0 lb

## 2019-08-05 DIAGNOSIS — I10 Essential (primary) hypertension: Secondary | ICD-10-CM | POA: Diagnosis not present

## 2019-08-05 DIAGNOSIS — R7301 Impaired fasting glucose: Secondary | ICD-10-CM | POA: Diagnosis not present

## 2019-08-05 DIAGNOSIS — E785 Hyperlipidemia, unspecified: Secondary | ICD-10-CM | POA: Diagnosis not present

## 2019-08-05 LAB — POCT GLYCOSYLATED HEMOGLOBIN (HGB A1C): Hemoglobin A1C: 5.8 % — AB (ref 4.0–5.6)

## 2019-08-05 MED ORDER — LISINOPRIL-HYDROCHLOROTHIAZIDE 20-25 MG PO TABS: 1.0000 | ORAL_TABLET | Freq: Every day | ORAL | 2 refills | Status: DC

## 2019-08-05 NOTE — Progress Notes (Signed)
Established Patient Office Visit  Subjective:  Patient ID: Selena Richard, female    DOB: [birth year]  Age: 53 y.o. MRN: [id number]  CC:  Chief Complaint  Patient presents with  . Hypertension  . ifg    HPI Selena Richard presents for   Hypertension- Pt denies chest pain, SOB, dizziness, or heart palpitations.  Taking meds as directed w/o problems.  Denies medication side effects.    Impaired fasting glucose-no increased thirst or urination. No symptoms consistent with hypoglycemia.   Past Medical History:  Diagnosis Date  . Hypertension   . Primary ovarian failure     Past Surgical History:  Procedure Laterality Date  . TONSILLECTOMY      Family History  Problem Relation Age of Onset  . Hypertension Father   . Atrial fibrillation Father   . Colon cancer Maternal Grandfather        around age 52    Social History   Socioeconomic History  . Marital status: Married    Spouse name: Not on file  . Number of children: Not on file  . Years of education: Not on file  . Highest education level: Not on file  Occupational History  . Not on file  Social Needs  . Financial resource strain: Not on file  . Food insecurity    Worry: Not on file    Inability: Not on file  . Transportation needs    Medical: Not on file    Non-medical: Not on file  Tobacco Use  . Smoking status: Never Smoker  . Smokeless tobacco: Never Used  Substance and Sexual Activity  . Alcohol use: No  . Drug use: No  . Sexual activity: Not on file  Lifestyle  . Physical activity    Days per week: Not on file    Minutes per session: Not on file  . Stress: Not on file  Relationships  . Social Herbalist on phone: Not on file    Gets together: Not on file    Attends religious service: Not on file    Active member of club or organization: Not on file    Attends meetings of clubs or organizations: Not on file    Relationship status: Not on file  . Intimate partner  violence    Fear of current or ex partner: Not on file    Emotionally abused: Not on file    Physically abused: Not on file    Forced sexual activity: Not on file  Other Topics Concern  . Not on file  Social History Narrative  . Not on file    Outpatient Medications Prior to Visit  Medication Sig Dispense Refill  . metoprolol tartrate (LOPRESSOR) 25 MG tablet Take 1 tablet (25 mg total) by mouth 2 (two) times daily. 180 tablet 2  . lisinopril-hydrochlorothiazide (ZESTORETIC) 20-25 MG tablet Take 1 tablet by mouth daily. 2 week supply given. Must schedule/keep appointment 15 tablet 0   No facility-administered medications prior to visit.     No Known Allergies  ROS Review of Systems    Objective:    Physical Exam  Constitutional: She is oriented to person, place, and time. She appears well-developed and well-nourished.  HENT:  Head: Normocephalic and atraumatic.  Cardiovascular: Normal rate, regular rhythm and normal heart sounds.  Pulmonary/Chest: Effort normal and breath sounds normal.  Neurological: She is alert and oriented to person, place, and time.  Skin: Skin is warm and dry.  Psychiatric: She has a normal mood and affect. Her behavior is normal.    BP 135/68   Pulse 75   Ht 5\' 6"  (1.676 m)   Wt 240 lb (108.9 kg)   SpO2 100%   BMI 38.74 kg/m  Wt Readings from Last 3 Encounters:  08/05/19 240 lb (108.9 kg)  08/13/18 241 lb (109.3 kg)  08/21/17 242 lb (109.8 kg)     Health Maintenance Due  Topic Date Due  . HIV Screening  07/25/1983  . PAP SMEAR-Modifier  12/23/2010  . TETANUS/TDAP  05/18/2018  . INFLUENZA VACCINE  07/24/2019    There are no preventive care reminders to display for this patient.  Lab Results  Component Value Date   TSH 4.347 06/02/2012   Lab Results  Component Value Date   WBC 9.8 06/02/2012   HGB 13.4 06/02/2012   HCT 40.2 06/02/2012   MCV 87.8 06/02/2012   PLT 301 06/02/2012   Lab Results  Component Value Date   NA  140 08/27/2018   K 4.1 08/27/2018   CO2 23 08/27/2018   GLUCOSE 128 08/27/2018   BUN 17 08/27/2018   CREATININE 0.91 08/27/2018   BILITOT 0.6 08/27/2018   ALKPHOS 81 10/24/2016   AST 23 08/27/2018   ALT 31 (H) 08/27/2018   PROT 7.1 08/27/2018   ALBUMIN 4.0 10/24/2016   CALCIUM 9.3 08/27/2018   Lab Results  Component Value Date   CHOL 200 (H) 08/27/2018   Lab Results  Component Value Date   HDL 39 (L) 08/27/2018   Lab Results  Component Value Date   LDLCALC 130 (H) 08/27/2018   Lab Results  Component Value Date   TRIG 177 (H) 08/27/2018   Lab Results  Component Value Date   CHOLHDL 5.1 (H) 08/27/2018   Lab Results  Component Value Date   HGBA1C 5.8 (A) 08/05/2019      Assessment & Plan:   Problem List Items Addressed This Visit      Cardiovascular and Mediastinum   HYPERTENSION, BENIGN - Primary    Well controlled. Continue current regimen. Follow up in  6 mo      Relevant Medications   lisinopril-hydrochlorothiazide (ZESTORETIC) 20-25 MG tablet   Other Relevant Orders   COMPLETE METABOLIC PANEL WITH GFR   Lipid panel   CBC     Endocrine   IFG (impaired fasting glucose)   Relevant Orders   POCT glycosylated hemoglobin (Hb A1C) (Completed)   COMPLETE METABOLIC PANEL WITH GFR   Lipid panel   CBC     Other   Hyperlipidemia   Relevant Medications   lisinopril-hydrochlorothiazide (ZESTORETIC) 20-25 MG tablet   Other Relevant Orders   COMPLETE METABOLIC PANEL WITH GFR   Lipid panel   CBC      Meds ordered this encounter  Medications  . lisinopril-hydrochlorothiazide (ZESTORETIC) 20-25 MG tablet    Sig: Take 1 tablet by mouth daily.    Dispense:  90 tablet    Refill:  2    Follow-up: Return in about 6 months (around 02/05/2020) for Hypertension and glucose .    Nani Gasseratherine Brycelynn Stampley, MD

## 2019-08-05 NOTE — Assessment & Plan Note (Signed)
Well controlled. Continue current regimen. Follow up in  6 mo  

## 2019-08-06 LAB — COMPLETE METABOLIC PANEL WITH GFR
AG Ratio: 1.3 (calc) (ref 1.0–2.5)
ALT: 32 U/L — ABNORMAL HIGH (ref 6–29)
AST: 26 U/L (ref 10–35)
Albumin: 4 g/dL (ref 3.6–5.1)
Alkaline phosphatase (APISO): 85 U/L (ref 37–153)
BUN: 18 mg/dL (ref 7–25)
CO2: 22 mmol/L (ref 20–32)
Calcium: 9.4 mg/dL (ref 8.6–10.4)
Chloride: 104 mmol/L (ref 98–110)
Creat: 0.95 mg/dL (ref 0.50–1.05)
GFR, Est African American: 80 mL/min/{1.73_m2} (ref >=60)
GFR, Est Non African American: 69 mL/min/{1.73_m2} (ref >=60)
Globulin: 3.1 g/dL (calc) (ref 1.9–3.7)
Glucose, Bld: 128 mg/dL — ABNORMAL HIGH (ref 65–99)
Potassium: 4.1 mmol/L (ref 3.5–5.3)
Sodium: 139 mmol/L (ref 135–146)
Total Bilirubin: 0.5 mg/dL (ref 0.2–1.2)
Total Protein: 7.1 g/dL (ref 6.1–8.1)

## 2019-08-06 LAB — CBC
HCT: 42.5 % (ref 35.0–45.0)
Hemoglobin: 14.9 g/dL (ref 11.7–15.5)
MCH: 31.4 pg (ref 27.0–33.0)
MCHC: 35.1 g/dL (ref 32.0–36.0)
MCV: 89.5 fL (ref 80.0–100.0)
MPV: 11.1 fL (ref 7.5–12.5)
Platelets: 291 10*3/uL (ref 140–400)
RBC: 4.75 10*6/uL (ref 3.80–5.10)
RDW: 12.4 % (ref 11.0–15.0)
WBC: 9.4 10*3/uL (ref 3.8–10.8)

## 2019-08-06 LAB — LIPID PANEL
Cholesterol: 202 mg/dL — ABNORMAL HIGH (ref <200)
HDL: 43 mg/dL — ABNORMAL LOW (ref >=50)
LDL Cholesterol (Calc): 131 mg/dL (calc) — ABNORMAL HIGH
Non-HDL Cholesterol (Calc): 159 mg/dL (calc) — ABNORMAL HIGH (ref <130)
Total CHOL/HDL Ratio: 4.7 (calc) (ref <5.0)
Triglycerides: 166 mg/dL — ABNORMAL HIGH (ref <150)

## 2019-09-09 ENCOUNTER — Other Ambulatory Visit: Payer: Self-pay | Admitting: Family Medicine

## 2020-02-07 ENCOUNTER — Ambulatory Visit: Payer: 59 | Admitting: Family Medicine

## 2020-03-27 ENCOUNTER — Other Ambulatory Visit: Payer: Self-pay | Admitting: Physician Assistant

## 2020-04-24 ENCOUNTER — Other Ambulatory Visit: Payer: Self-pay | Admitting: Physician Assistant

## 2020-05-25 ENCOUNTER — Other Ambulatory Visit: Payer: Self-pay | Admitting: Family Medicine

## 2020-06-23 ENCOUNTER — Telehealth: Payer: Self-pay | Admitting: Family Medicine

## 2020-06-23 NOTE — Telephone Encounter (Signed)
Call pt: see if had had pap smear on the last few yrs or colon cancer screening.  If not then please offer

## 2020-06-27 ENCOUNTER — Other Ambulatory Visit: Payer: Self-pay | Admitting: Family Medicine

## 2020-06-28 NOTE — Telephone Encounter (Signed)
LVM asking that she either call back or send a my chart about having her Pap or Colon Ca screening done.

## 2020-07-04 ENCOUNTER — Encounter: Payer: Self-pay | Admitting: *Deleted

## 2020-07-04 NOTE — Telephone Encounter (Signed)
LVM asking pt to rtn call or send my chart about pap/colon ca screening.   Sent a [employer].

## 2020-08-20 ENCOUNTER — Encounter: Payer: Self-pay | Admitting: Family Medicine

## 2021-01-01 ENCOUNTER — Encounter: Payer: Self-pay | Admitting: Family Medicine

## 2021-01-01 ENCOUNTER — Other Ambulatory Visit: Payer: Self-pay

## 2021-01-01 ENCOUNTER — Ambulatory Visit (INDEPENDENT_AMBULATORY_CARE_PROVIDER_SITE_OTHER): Payer: 59 | Admitting: Family Medicine

## 2021-01-01 VITALS — BP 108/61 | HR 73 | Ht 66.0 in | Wt 262.0 lb

## 2021-01-01 DIAGNOSIS — Z23 Encounter for immunization: Secondary | ICD-10-CM

## 2021-01-01 DIAGNOSIS — Z1159 Encounter for screening for other viral diseases: Secondary | ICD-10-CM | POA: Diagnosis not present

## 2021-01-01 DIAGNOSIS — R7301 Impaired fasting glucose: Secondary | ICD-10-CM | POA: Diagnosis not present

## 2021-01-01 DIAGNOSIS — Z1231 Encounter for screening mammogram for malignant neoplasm of breast: Secondary | ICD-10-CM | POA: Diagnosis not present

## 2021-01-01 DIAGNOSIS — I10 Essential (primary) hypertension: Secondary | ICD-10-CM | POA: Diagnosis not present

## 2021-01-01 LAB — POCT GLYCOSYLATED HEMOGLOBIN (HGB A1C): Hemoglobin A1C: 5.8 % — AB (ref 4.0–5.6)

## 2021-01-01 MED ORDER — METOPROLOL TARTRATE 25 MG PO TABS: 25.0000 mg | ORAL_TABLET | Freq: Two times a day (BID) | ORAL | 1 refills | Status: DC

## 2021-01-01 MED ORDER — LISINOPRIL-HYDROCHLOROTHIAZIDE 20-25 MG PO TABS: 1.0000 | ORAL_TABLET | Freq: Every day | ORAL | 1 refills | Status: DC

## 2021-01-01 NOTE — Assessment & Plan Note (Signed)
Well controlled. Continue current regimen. Follow up in  6 mo. for labs.  Medication refill sent to pharmacy.

## 2021-01-01 NOTE — Progress Notes (Signed)
Established Patient Office Visit  Subjective:  Patient ID: Selena Richard, female    DOB: [birth year]  Age: 55 y.o. MRN: [id number]  CC:  Chief Complaint  Patient presents with  . Hypertension    HPI Selena Richard presents for   Hypertension- Pt denies chest pain, SOB, dizziness, or heart palpitations.  Taking meds as directed w/o problems.  Denies medication side effects.    Have not seen her in over a year.  Needs refills today.  Overdue for mammogram last was in 2019.  Overdue for Tdap.  Also discussed overdue for colon cancer screening and discussed options.  Past Medical History:  Diagnosis Date  . Hypertension   . Primary ovarian failure     Past Surgical History:  Procedure Laterality Date  . TONSILLECTOMY      Family History  Problem Relation Age of Onset  . Hypertension Father   . Atrial fibrillation Father   . Colon cancer Maternal Grandfather        around age 51    Social History   Socioeconomic History  . Marital status: Married    Spouse name: Not on file  . Number of children: Not on file  . Years of education: Not on file  . Highest education level: Not on file  Occupational History  . Not on file  Tobacco Use  . Smoking status: Never Smoker  . Smokeless tobacco: Never Used  Substance and Sexual Activity  . Alcohol use: No  . Drug use: No  . Sexual activity: Not on file  Other Topics Concern  . Not on file  Social History Narrative  . Not on file   Social Determinants of Health   Financial Resource Strain: Not on file  Food Insecurity: Not on file  Transportation Needs: Not on file  Physical Activity: Not on file  Stress: Not on file  Social Connections: Not on file  Intimate Partner Violence: Not on file    Outpatient Medications Prior to Visit  Medication Sig Dispense Refill  . lisinopril-hydrochlorothiazide (ZESTORETIC) 20-25 MG tablet Take 1 tablet by mouth daily. 2 WEEK REFILL GIVEN. MUST SCHEDULE AN APPOINTMENT  AND LABS FOR REFILLS 15 tablet 0  . metoprolol tartrate (LOPRESSOR) 25 MG tablet TAKE 1 TABLET (25 MG TOTAL) BY MOUTH 2 (TWO) TIMES DAILY. NEEDS APPT FOR ADDT'L REFILLS 30 tablet 0   No facility-administered medications prior to visit.    No Known Allergies  ROS Review of Systems    Objective:    Physical Exam Constitutional:      Appearance: She is well-developed and well-nourished.  HENT:     Head: Normocephalic and atraumatic.  Cardiovascular:     Rate and Rhythm: Normal rate and regular rhythm.     Heart sounds: Normal heart sounds.  Pulmonary:     Effort: Pulmonary effort is normal.     Breath sounds: Normal breath sounds.  Skin:    General: Skin is warm and dry.  Neurological:     Mental Status: She is alert and oriented to person, place, and time.  Psychiatric:        Mood and Affect: Mood and affect normal.        Behavior: Behavior normal.     BP 108/61   Pulse 73   Ht 5\' 6"  (1.676 m)   Wt 262 lb (118.8 kg)   SpO2 100%   BMI 42.29 kg/m  Wt Readings from Last 3 Encounters:  01/01/21 262 lb (118.8  kg)  08/05/19 240 lb (108.9 kg)  08/13/18 241 lb (109.3 kg)     Health Maintenance Due  Topic Date Due  . Hepatitis C Screening  Never done  . PAP SMEAR-Modifier  12/23/2010  . COLONOSCOPY (Pts 45-58yrs Insurance coverage will need to be confirmed)  Never done  . MAMMOGRAM  08/27/2020  . COVID-19 Vaccine (2 - Moderna 3-dose booster series) 09/26/2020    There are no preventive care reminders to display for this patient.  Lab Results  Component Value Date   TSH 4.347 06/02/2012   Lab Results  Component Value Date   WBC 9.4 08/05/2019   HGB 14.9 08/05/2019   HCT 42.5 08/05/2019   MCV 89.5 08/05/2019   PLT 291 08/05/2019   Lab Results  Component Value Date   NA 139 08/05/2019   K 4.1 08/05/2019   CO2 22 08/05/2019   GLUCOSE 128 (H) 08/05/2019   BUN 18 08/05/2019   CREATININE 0.95 08/05/2019   BILITOT 0.5 08/05/2019   ALKPHOS 81 10/24/2016    AST 26 08/05/2019   ALT 32 (H) 08/05/2019   PROT 7.1 08/05/2019   ALBUMIN 4.0 10/24/2016   CALCIUM 9.4 08/05/2019   Lab Results  Component Value Date   CHOL 202 (H) 08/05/2019   Lab Results  Component Value Date   HDL 43 (L) 08/05/2019   Lab Results  Component Value Date   LDLCALC 131 (H) 08/05/2019   Lab Results  Component Value Date   TRIG 166 (H) 08/05/2019   Lab Results  Component Value Date   CHOLHDL 4.7 08/05/2019   Lab Results  Component Value Date   HGBA1C 5.8 (A) 01/01/2021      Assessment & Plan:   Problem List Items Addressed This Visit      Cardiovascular and Mediastinum   HYPERTENSION, BENIGN - Primary    Well controlled. Continue current regimen. Follow up in  6 mo. for labs.  Medication refill sent to pharmacy.      Relevant Medications   lisinopril-hydrochlorothiazide (ZESTORETIC) 20-25 MG tablet   metoprolol tartrate (LOPRESSOR) 25 MG tablet   Other Relevant Orders   Hepatitis C Antibody   COMPLETE METABOLIC PANEL WITH GFR   Lipid panel     Endocrine   IFG (impaired fasting glucose)   Relevant Orders   Hepatitis C Antibody   COMPLETE METABOLIC PANEL WITH GFR   Lipid panel   POCT glycosylated hemoglobin (Hb A1C) (Completed)    Other Visit Diagnoses    Screening mammogram, encounter for       Relevant Orders   MM 3D SCREEN BREAST BILATERAL   Encounter for hepatitis C screening test for low risk patient       Relevant Orders   Hepatitis C Antibody   Need for Tdap vaccination       Relevant Orders   Tdap vaccine greater than or equal to 7yo IM (Completed)     mammo ordered.  Encouraged her to schedule.  Get hepatitis C screening on blood work.  Discussed colon cancer screening guidelines as well as options.  Encouraged to let me know which she would prefer to do and I will be happy to make referral.  Meds ordered this encounter  Medications  . lisinopril-hydrochlorothiazide (ZESTORETIC) 20-25 MG tablet    Sig: Take 1 tablet  by mouth daily.    Dispense:  90 tablet    Refill:  1  . metoprolol tartrate (LOPRESSOR) 25 MG tablet    Sig: Take  1 tablet (25 mg total) by mouth 2 (two) times daily.    Dispense:  180 tablet    Refill:  1    Follow-up: Return in about 6 months (around 07/01/2021) for Hypertension.    Nani Gasser, MD

## 2021-01-02 LAB — COMPLETE METABOLIC PANEL WITH GFR
AG Ratio: 1.5 (calc) (ref 1.0–2.5)
ALT: 33 U/L — ABNORMAL HIGH (ref 6–29)
AST: 24 U/L (ref 10–35)
Albumin: 4 g/dL (ref 3.6–5.1)
Alkaline phosphatase (APISO): 77 U/L (ref 37–153)
BUN: 17 mg/dL (ref 7–25)
CO2: 25 mmol/L (ref 20–32)
Calcium: 9.3 mg/dL (ref 8.6–10.4)
Chloride: 106 mmol/L (ref 98–110)
Creat: 1.05 mg/dL (ref 0.50–1.05)
GFR, Est African American: 71 mL/min/{1.73_m2} (ref >=60)
GFR, Est Non African American: 61 mL/min/{1.73_m2} (ref >=60)
Globulin: 2.6 g/dL (calc) (ref 1.9–3.7)
Glucose, Bld: 117 mg/dL — ABNORMAL HIGH (ref 65–99)
Potassium: 4.2 mmol/L (ref 3.5–5.3)
Sodium: 141 mmol/L (ref 135–146)
Total Bilirubin: 0.5 mg/dL (ref 0.2–1.2)
Total Protein: 6.6 g/dL (ref 6.1–8.1)

## 2021-01-02 LAB — LIPID PANEL
Cholesterol: 203 mg/dL — ABNORMAL HIGH (ref <200)
HDL: 41 mg/dL — ABNORMAL LOW (ref >=50)
LDL Cholesterol (Calc): 130 mg/dL (calc) — ABNORMAL HIGH
Non-HDL Cholesterol (Calc): 162 mg/dL (calc) — ABNORMAL HIGH (ref <130)
Total CHOL/HDL Ratio: 5 (calc) — ABNORMAL HIGH (ref <5.0)
Triglycerides: 181 mg/dL — ABNORMAL HIGH (ref <150)

## 2021-01-02 LAB — HEPATITIS C ANTIBODY
Hepatitis C Ab: NONREACTIVE
SIGNAL TO CUT-OFF: 0.01 (ref <1.00)

## 2021-02-08 ENCOUNTER — Telehealth: Payer: Self-pay | Admitting: Family Medicine

## 2021-02-08 NOTE — Telephone Encounter (Signed)
Call pt and see where she gets paps performed so we can get most recent copy. Unless she has had a hysterectomy.   Also please remind her to schedule her mammogram.

## 2021-02-09 NOTE — Telephone Encounter (Signed)
LVM asking pt to rtn call about pap and reminded her to schedule the mammogram

## 2021-05-17 ENCOUNTER — Other Ambulatory Visit: Payer: Self-pay | Admitting: Family Medicine

## 2021-05-17 DIAGNOSIS — I10 Essential (primary) hypertension: Secondary | ICD-10-CM

## 2021-06-26 ENCOUNTER — Other Ambulatory Visit: Payer: Self-pay | Admitting: Family Medicine

## 2021-06-26 DIAGNOSIS — I10 Essential (primary) hypertension: Secondary | ICD-10-CM

## 2021-07-25 ENCOUNTER — Other Ambulatory Visit: Payer: Self-pay | Admitting: Family Medicine

## 2021-07-25 DIAGNOSIS — I10 Essential (primary) hypertension: Secondary | ICD-10-CM

## 2021-07-31 NOTE — Telephone Encounter (Signed)
Please call pt and inform her that she will need a f/u for bp and labs for refills. Thanks.

## 2021-08-01 NOTE — Telephone Encounter (Signed)
Pt informed of recommendations and 30 day refill.

## 2021-08-28 ENCOUNTER — Other Ambulatory Visit: Payer: Self-pay | Admitting: Family Medicine

## 2021-08-28 DIAGNOSIS — I10 Essential (primary) hypertension: Secondary | ICD-10-CM

## 2021-08-28 NOTE — Telephone Encounter (Signed)
Please call pt she will need an appointment and labs for bp medication refills.  30 day supply sent.

## 2021-08-29 NOTE — Telephone Encounter (Signed)
LVM informing patient of this.

## 2021-09-10 ENCOUNTER — Ambulatory Visit (INDEPENDENT_AMBULATORY_CARE_PROVIDER_SITE_OTHER): Payer: 59 | Admitting: Family Medicine

## 2021-09-10 ENCOUNTER — Encounter: Payer: Self-pay | Admitting: Family Medicine

## 2021-09-10 ENCOUNTER — Other Ambulatory Visit: Payer: Self-pay

## 2021-09-10 VITALS — BP 125/47 | HR 63 | Ht 66.0 in | Wt 214.0 lb

## 2021-09-10 DIAGNOSIS — I10 Essential (primary) hypertension: Secondary | ICD-10-CM | POA: Diagnosis not present

## 2021-09-10 DIAGNOSIS — R7301 Impaired fasting glucose: Secondary | ICD-10-CM

## 2021-09-10 DIAGNOSIS — R748 Abnormal levels of other serum enzymes: Secondary | ICD-10-CM | POA: Diagnosis not present

## 2021-09-10 LAB — POCT GLYCOSYLATED HEMOGLOBIN (HGB A1C): Hemoglobin A1C: 5.1 % (ref 4.0–5.6)

## 2021-09-10 MED ORDER — LISINOPRIL-HYDROCHLOROTHIAZIDE 20-25 MG PO TABS: 1.0000 | ORAL_TABLET | Freq: Every day | ORAL | 1 refills | Status: DC

## 2021-09-10 MED ORDER — METOPROLOL TARTRATE 25 MG PO TABS: 25.0000 mg | ORAL_TABLET | Freq: Two times a day (BID) | ORAL | 1 refills | Status: DC

## 2021-09-10 NOTE — Assessment & Plan Note (Signed)
EP well-controlled and encouraged her to monitor for any lows especially as she continues to lose weight.  She starts to feel lightheaded or dizzy please let me know.

## 2021-09-10 NOTE — Assessment & Plan Note (Signed)
Is likely secondary to fatty liver.  She denies drinking alcohol.  We did discuss that weight loss and exercise is what helps reduce the fat content in the liver which in turn reduces the inflammation.  We did discuss that continued inflammation can lead to cirrhosis.  I do think some of her changes will make a great difference I am hoping her liver enzymes are actually back down to normal this time we did discuss that if they are still elevated I really would like to move forward with an ultrasound.

## 2021-09-10 NOTE — Progress Notes (Signed)
Established Patient Office Visit  Subjective:  Patient ID: Selena Richard, female    DOB: [birth year]  Age: 55 y.o. MRN: [id number]  CC:  Chief Complaint  Patient presents with   Hypertension   ifg    HPI Selena Richard presents for    Hypertension- Pt denies chest pain, SOB, dizziness, or heart palpitations.  Taking meds as directed w/o problems.  Denies medication side effects.    Impaired fasting glucose-no increased thirst or urination. No symptoms consistent with hypoglycemia.  F/U elevated liver enzymes.  They have been elevated on and off.  Had recommended further follow-up with ultrasound.  She started weight watchers about 3 months ago and has been doing really well with that she said she had done the program about 20 years ago and was successful at that time as well.  She likes the flexibility of the new program.  She is starting to incorporate some exercise into her routine.   Past Medical History:  Diagnosis Date   Hypertension    Primary ovarian failure     Past Surgical History:  Procedure Laterality Date   TONSILLECTOMY      Family History  Problem Relation Age of Onset   Hypertension Father    Atrial fibrillation Father    Colon cancer Maternal Grandfather        around age 39    Social History   Socioeconomic History   Marital status: Married    Spouse name: Not on file   Number of children: Not on file   Years of education: Not on file   Highest education level: Not on file  Occupational History   Not on file  Tobacco Use   Smoking status: Never   Smokeless tobacco: Never  Substance and Sexual Activity   Alcohol use: No   Drug use: No   Sexual activity: Not on file  Other Topics Concern   Not on file  Social History Narrative   Not on file   Social Determinants of Health   Financial Resource Strain: Not on file  Food Insecurity: Not on file  Transportation Needs: Not on file  Physical Activity: Not on file  Stress:  Not on file  Social Connections: Not on file  Intimate Partner Violence: Not on file    Outpatient Medications Prior to Visit  Medication Sig Dispense Refill   lisinopril-hydrochlorothiazide (ZESTORETIC) 20-25 MG tablet TAKE 1 TABLET BY MOUTH EVERY DAY 30 tablet 5   metoprolol tartrate (LOPRESSOR) 25 MG tablet Take 1 tablet (25 mg total) by mouth 2 (two) times daily. 30 day supply given. Please call office to schedule and keep appointment for refills 60 tablet 0   No facility-administered medications prior to visit.    No Known Allergies  ROS Review of Systems    Objective:    Physical Exam Constitutional:      Appearance: Normal appearance. She is well-developed.  HENT:     Head: Normocephalic and atraumatic.  Cardiovascular:     Rate and Rhythm: Normal rate and regular rhythm.     Heart sounds: Normal heart sounds.  Pulmonary:     Effort: Pulmonary effort is normal.     Breath sounds: Normal breath sounds.  Skin:    General: Skin is warm and dry.  Neurological:     Mental Status: She is alert and oriented to person, place, and time.  Psychiatric:        Behavior: Behavior normal.    BP Marland Kitchen)  125/47   Pulse 63   Ht 5\' 6"  (1.676 m)   Wt 214 lb (97.1 kg)   SpO2 100%   BMI 34.54 kg/m  Wt Readings from Last 3 Encounters:  09/10/21 214 lb (97.1 kg)  01/01/21 262 lb (118.8 kg)  08/05/19 240 lb (108.9 kg)     There are no preventive care reminders to display for this patient.   There are no preventive care reminders to display for this patient.  Lab Results  Component Value Date   TSH 4.347 06/02/2012   Lab Results  Component Value Date   WBC 9.4 08/05/2019   HGB 14.9 08/05/2019   HCT 42.5 08/05/2019   MCV 89.5 08/05/2019   PLT 291 08/05/2019   Lab Results  Component Value Date   NA 141 01/01/2021   K 4.2 01/01/2021   CO2 25 01/01/2021   GLUCOSE 117 (H) 01/01/2021   BUN 17 01/01/2021   CREATININE 1.05 01/01/2021   BILITOT 0.5 01/01/2021    ALKPHOS 81 10/24/2016   AST 24 01/01/2021   ALT 33 (H) 01/01/2021   PROT 6.6 01/01/2021   ALBUMIN 4.0 10/24/2016   CALCIUM 9.3 01/01/2021   Lab Results  Component Value Date   CHOL 203 (H) 01/01/2021   Lab Results  Component Value Date   HDL 41 (L) 01/01/2021   Lab Results  Component Value Date   LDLCALC 130 (H) 01/01/2021   Lab Results  Component Value Date   TRIG 181 (H) 01/01/2021   Lab Results  Component Value Date   CHOLHDL 5.0 (H) 01/01/2021   Lab Results  Component Value Date   HGBA1C 5.1 09/10/2021      Assessment & Plan:   Problem List Items Addressed This Visit       Cardiovascular and Mediastinum   HYPERTENSION, BENIGN - Primary    EP well-controlled and encouraged her to monitor for any lows especially as she continues to lose weight.  She starts to feel lightheaded or dizzy please let me know.      Relevant Medications   lisinopril-hydrochlorothiazide (ZESTORETIC) 20-25 MG tablet   metoprolol tartrate (LOPRESSOR) 25 MG tablet   Other Relevant Orders   COMPLETE METABOLIC PANEL WITH GFR     Endocrine   IFG (impaired fasting glucose)    Looks phenomenal today at 5.1 down from 5.8 which is fantastic.      Relevant Orders   POCT glycosylated hemoglobin (Hb A1C) (Completed)     Other   Elevated liver enzymes    Is likely secondary to fatty liver.  She denies drinking alcohol.  We did discuss that weight loss and exercise is what helps reduce the fat content in the liver which in turn reduces the inflammation.  We did discuss that continued inflammation can lead to cirrhosis.  I do think some of her changes will make a great difference I am hoping her liver enzymes are actually back down to normal this time we did discuss that if they are still elevated I really would like to move forward with an ultrasound.      Relevant Orders   COMPLETE METABOLIC PANEL WITH GFR    Meds ordered this encounter  Medications   lisinopril-hydrochlorothiazide  (ZESTORETIC) 20-25 MG tablet    Sig: Take 1 tablet by mouth daily.    Dispense:  90 tablet    Refill:  1   metoprolol tartrate (LOPRESSOR) 25 MG tablet    Sig: Take 1 tablet (25 mg total)  by mouth 2 (two) times daily.    Dispense:  180 tablet    Refill:  1    Follow-up: Return in about 6 months (around 03/10/2022) for full labs and BP , Hypertension.    Nani Gasser, MD

## 2021-09-10 NOTE — Assessment & Plan Note (Signed)
Looks phenomenal today at 5.1 down from 5.8 which is fantastic.

## 2021-09-11 LAB — COMPLETE METABOLIC PANEL WITH GFR
AG Ratio: 1.7 (calc) (ref 1.0–2.5)
ALT: 32 U/L — ABNORMAL HIGH (ref 6–29)
AST: 24 U/L (ref 10–35)
Albumin: 4.3 g/dL (ref 3.6–5.1)
Alkaline phosphatase (APISO): 78 U/L (ref 37–153)
BUN: 19 mg/dL (ref 7–25)
CO2: 27 mmol/L (ref 20–32)
Calcium: 9.6 mg/dL (ref 8.6–10.4)
Chloride: 104 mmol/L (ref 98–110)
Creat: 0.89 mg/dL (ref 0.50–1.03)
Globulin: 2.6 g/dL (calc) (ref 1.9–3.7)
Glucose, Bld: 91 mg/dL (ref 65–99)
Potassium: 3.6 mmol/L (ref 3.5–5.3)
Sodium: 140 mmol/L (ref 135–146)
Total Bilirubin: 0.5 mg/dL (ref 0.2–1.2)
Total Protein: 6.9 g/dL (ref 6.1–8.1)
eGFR: 77 mL/min/{1.73_m2} (ref >=60)

## 2021-09-11 NOTE — Progress Notes (Signed)
Hi Tayden, overall metabolic panel looks good.  ALT liver enzyme is about the same that its been the last several times so at least it stable.  Continue to work on healthy diet and regular exercise.

## 2021-09-19 ENCOUNTER — Ambulatory Visit (INDEPENDENT_AMBULATORY_CARE_PROVIDER_SITE_OTHER): Payer: 59

## 2021-09-19 ENCOUNTER — Other Ambulatory Visit: Payer: Self-pay

## 2021-09-19 DIAGNOSIS — Z1231 Encounter for screening mammogram for malignant neoplasm of breast: Secondary | ICD-10-CM | POA: Diagnosis not present

## 2021-09-24 NOTE — Progress Notes (Signed)
Please call patient. Normal mammogram.  Repeat in 1 year.  

## 2021-10-03 ENCOUNTER — Telehealth: Payer: Self-pay | Admitting: Family Medicine

## 2021-10-03 NOTE — Telephone Encounter (Signed)
Lease call pt and see if she has had a chance to get her pap done. We can schedule her here if she would like

## 2021-10-12 NOTE — Telephone Encounter (Signed)
LVM asking pt to either call or send my chart about getting pap done if she hasn't already done so. If she has to let us know so that we can up date her chart.

## 2021-10-25 IMAGING — MG MM DIGITAL SCREENING BILAT W/ TOMO AND CAD
8 series · 8 of 24 positions shown · non-contrast
Comparison: Previous exam(s).

CLINICAL DATA: Screening.

EXAM:
DIGITAL SCREENING BILATERAL MAMMOGRAM WITH TOMOSYNTHESIS AND CAD
TECHNIQUE: Bilateral screening digital craniocaudal and mediolateral oblique
mammograms were obtained. Bilateral screening digital breast
tomosynthesis was performed. The images were evaluated with
computer-aided detection.

[R MLO synth-2D]
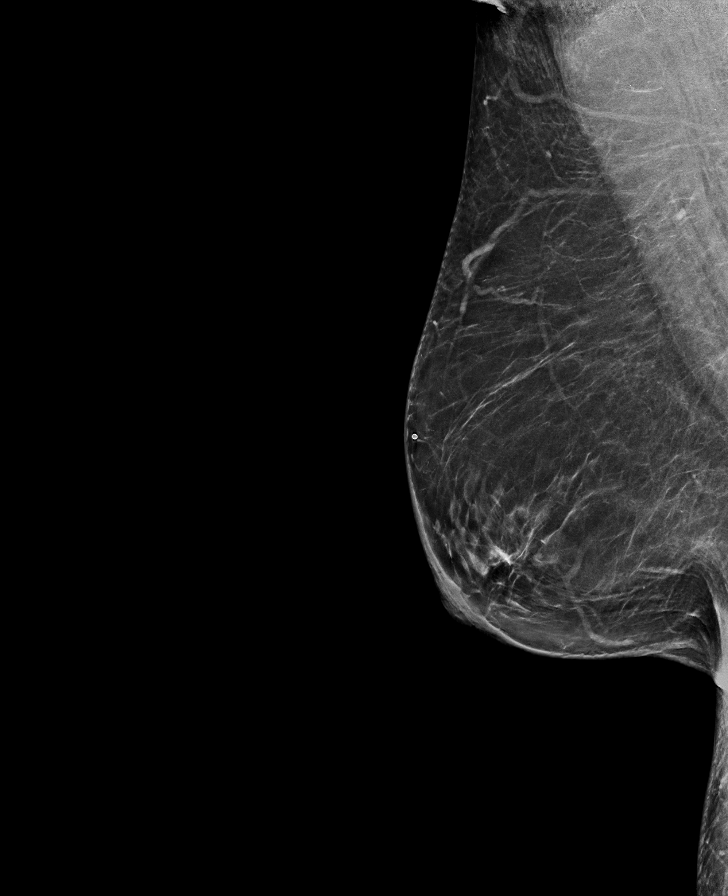

[R CC synth-2D]
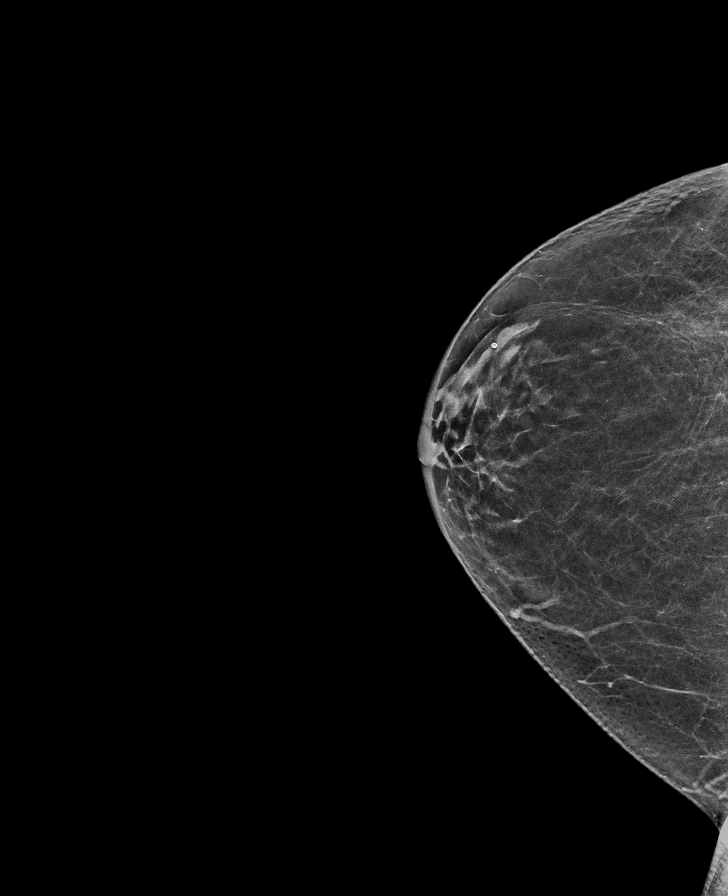

[L MLO synth-2D]
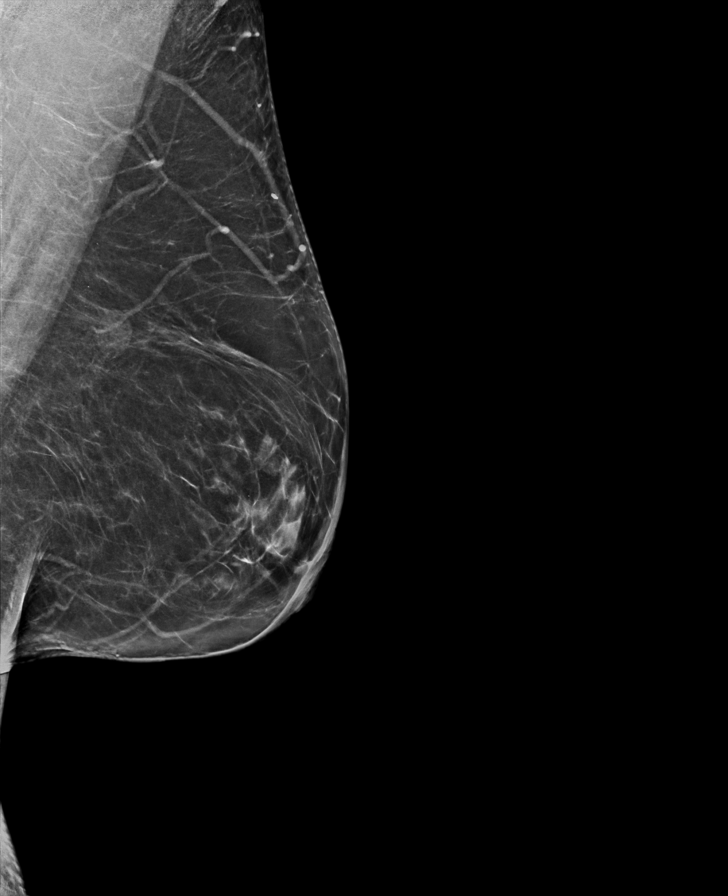

[L CC synth-2D]
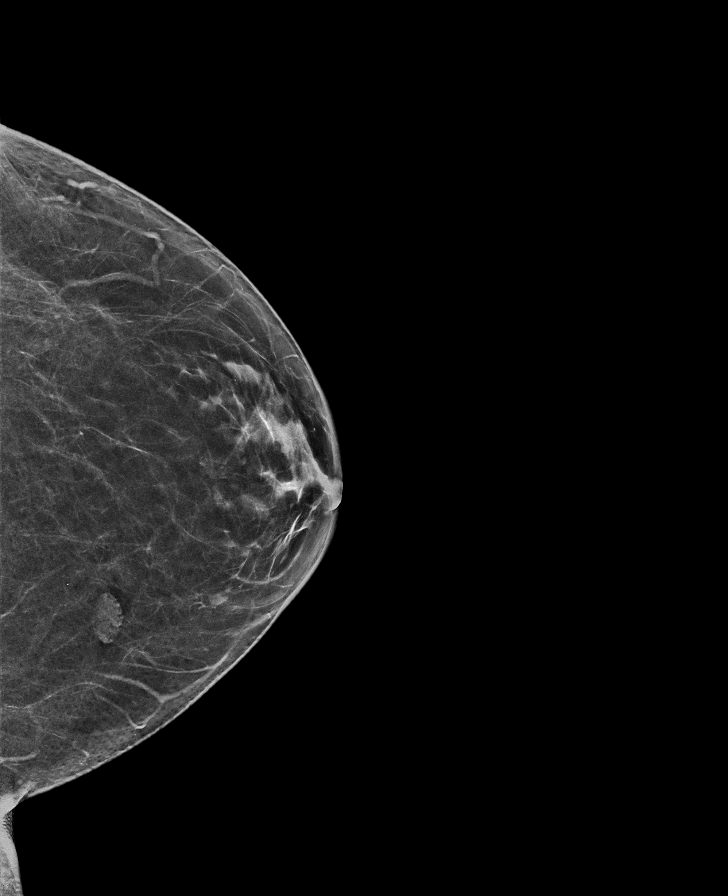

[L MLO tomo · tomo slice 35/69.0]
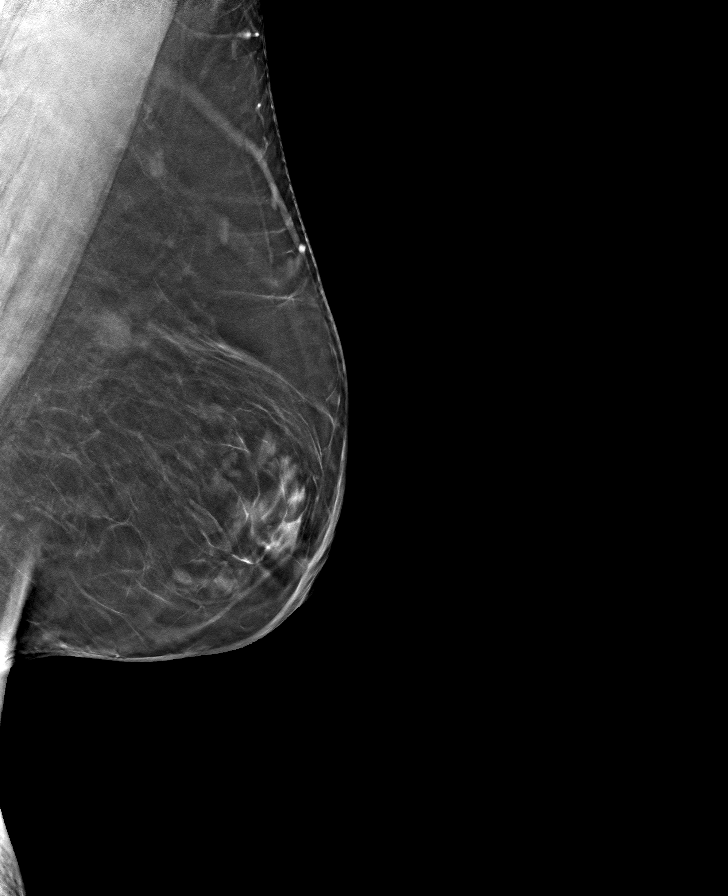

[L CC tomo · tomo slice 32/63.0]
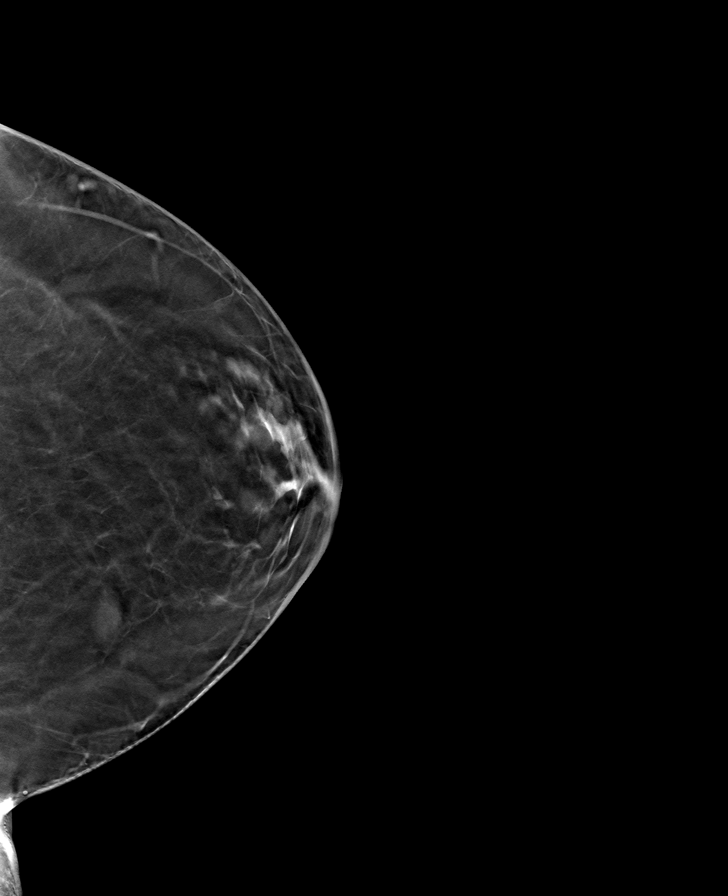

[R CC tomo · tomo slice 31/60.0]
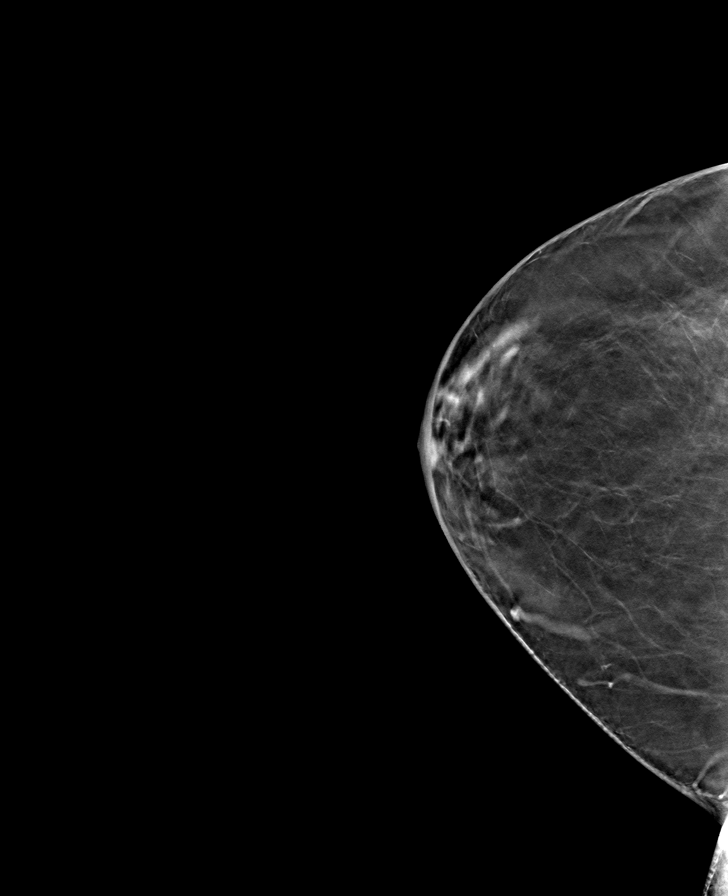

[R MLO tomo · tomo slice 36/71.0]
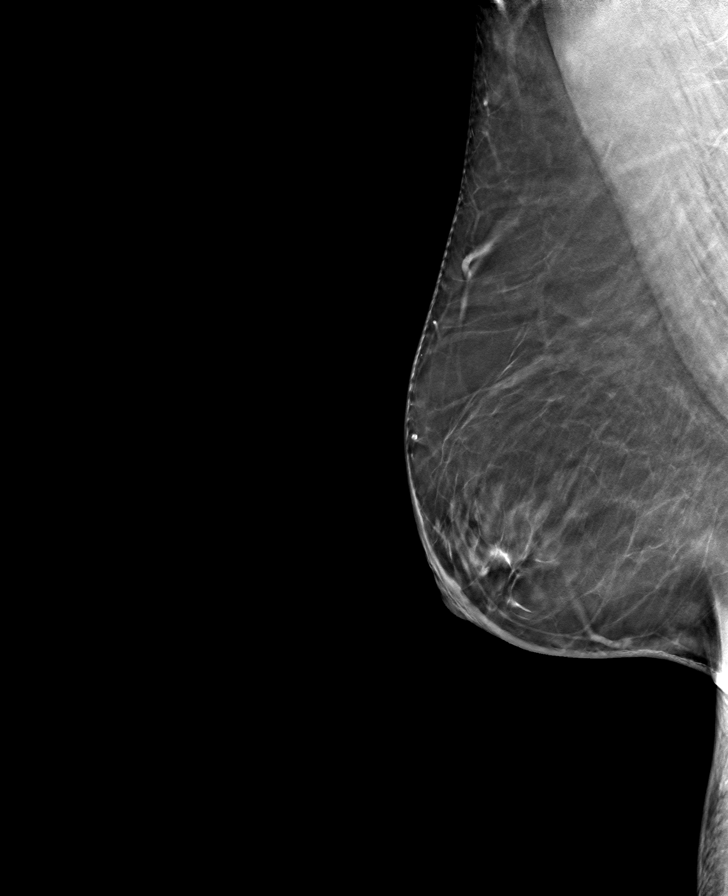

[8 of 24 positions shown; findings below may reference images not displayed]

ACR Breast Density Category b: There are scattered areas of
fibroglandular density.
FINDINGS: There are no findings suspicious for malignancy.
IMPRESSION: No mammographic evidence of malignancy. A result letter of this
screening mammogram will be mailed directly to the patient.

RECOMMENDATION:
Screening mammogram in one year. (Code:51-O-LD2)

BI-RADS CATEGORY  1: Negative.

## 2022-03-11 ENCOUNTER — Ambulatory Visit (INDEPENDENT_AMBULATORY_CARE_PROVIDER_SITE_OTHER): Payer: 59 | Admitting: Family Medicine

## 2022-03-11 ENCOUNTER — Encounter: Payer: Self-pay | Admitting: Family Medicine

## 2022-03-11 ENCOUNTER — Other Ambulatory Visit: Payer: Self-pay

## 2022-03-11 VITALS — BP 122/56 | HR 60 | Wt 186.0 lb

## 2022-03-11 DIAGNOSIS — I10 Essential (primary) hypertension: Secondary | ICD-10-CM | POA: Diagnosis not present

## 2022-03-11 DIAGNOSIS — R748 Abnormal levels of other serum enzymes: Secondary | ICD-10-CM | POA: Diagnosis not present

## 2022-03-11 DIAGNOSIS — R7301 Impaired fasting glucose: Secondary | ICD-10-CM

## 2022-03-11 MED ORDER — METOPROLOL TARTRATE 25 MG PO TABS: 25.0000 mg | ORAL_TABLET | Freq: Two times a day (BID) | ORAL | 1 refills | Status: DC

## 2022-03-11 MED ORDER — LISINOPRIL-HYDROCHLOROTHIAZIDE 20-25 MG PO TABS: 1.0000 | ORAL_TABLET | Freq: Every day | ORAL | 1 refills | Status: DC

## 2022-03-11 NOTE — Assessment & Plan Note (Signed)
Due for a1C today.  Will get labs on bloodwork.   ?

## 2022-03-11 NOTE — Assessment & Plan Note (Addendum)
Well controlled. Continue current regimen. Follow up in  6 mo  ?Encouraged her to work on routine exercise. She has been walking some.  ?

## 2022-03-11 NOTE — Assessment & Plan Note (Signed)
Recheck liver enzymes

## 2022-03-11 NOTE — Progress Notes (Signed)
? ?Established Patient Office Visit ? ?Subjective:  ?Patient ID: Selena Richard, female    DOB: 1966-10-11  Age: 56 y.o. MRN: [id number] ? ?CC:  ?Chief Complaint  ?Patient presents with  ? Hypertension  ? ? ?HPI ?Selena Richard presents for  ? ?Hypertension- Pt denies chest pain, SOB, dizziness, or heart palpitations.  Taking meds as directed w/o problems.  Denies medication side effects.   ? ?Impaired fasting glucose-No symptoms consistent with hypoglycemia. ? ?Have been following mildly elevated ALT as well.   ? ?Past Medical History:  ?Diagnosis Date  ? Hypertension   ? Primary ovarian failure   ? ? ?Past Surgical History:  ?Procedure Laterality Date  ? TONSILLECTOMY    ? ? ?Family History  ?Problem Relation Age of Onset  ? Hypertension Father   ? Atrial fibrillation Father   ? Colon cancer Maternal Grandfather   ?     around age 26  ? ? ?Social History  ? ?Socioeconomic History  ? Marital status: Married  ?  Spouse name: Not on file  ? Number of children: Not on file  ? Years of education: Not on file  ? Highest education level: Not on file  ?Occupational History  ? Not on file  ?Tobacco Use  ? Smoking status: Never  ? Smokeless tobacco: Never  ?Substance and Sexual Activity  ? Alcohol use: No  ? Drug use: No  ? Sexual activity: Not on file  ?Other Topics Concern  ? Not on file  ?Social History Narrative  ? Not on file  ? ?Social Determinants of Health  ? ?Financial Resource Strain: Not on file  ?Food Insecurity: Not on file  ?Transportation Needs: Not on file  ?Physical Activity: Not on file  ?Stress: Not on file  ?Social Connections: Not on file  ?Intimate Partner Violence: Not on file  ? ? ?Outpatient Medications Prior to Visit  ?Medication Sig Dispense Refill  ? lisinopril-hydrochlorothiazide (ZESTORETIC) 20-25 MG tablet Take 1 tablet by mouth daily. 90 tablet 1  ? metoprolol tartrate (LOPRESSOR) 25 MG tablet Take 1 tablet (25 mg total) by mouth 2 (two) times daily. 180 tablet 1  ? ?No  facility-administered medications prior to visit.  ? ? ?No Known Allergies ? ?ROS ?Review of Systems ? ?  ?Objective:  ?  ?Physical Exam ?Constitutional:   ?   Appearance: Normal appearance. She is well-developed.  ?HENT:  ?   Head: Normocephalic and atraumatic.  ?Cardiovascular:  ?   Rate and Rhythm: Normal rate and regular rhythm.  ?   Heart sounds: Normal heart sounds.  ?Pulmonary:  ?   Effort: Pulmonary effort is normal.  ?   Breath sounds: Normal breath sounds.  ?Skin: ?   General: Skin is warm and dry.  ?Neurological:  ?   Mental Status: She is alert and oriented to person, place, and time.  ?Psychiatric:     ?   Behavior: Behavior normal.  ? ? ?BP (!) 122/56 (BP Location: Left Arm)   Pulse 60   Wt 186 lb (84.4 kg)   SpO2 100%   BMI 30.02 kg/m?  ?Wt Readings from Last 3 Encounters:  ?03/11/22 186 lb (84.4 kg)  ?09/10/21 214 lb (97.1 kg)  ?01/01/21 262 lb (118.8 kg)  ? ? ? ?There are no preventive care reminders to display for this patient. ? ?There are no preventive care reminders to display for this patient. ? ?Lab Results  ?Component Value Date  ? TSH 4.347 06/02/2012  ? ?  Lab Results  ?Component Value Date  ? WBC 9.4 08/05/2019  ? HGB 14.9 08/05/2019  ? HCT 42.5 08/05/2019  ? MCV 89.5 08/05/2019  ? PLT 291 08/05/2019  ? ?Lab Results  ?Component Value Date  ? NA 140 09/10/2021  ? K 3.6 09/10/2021  ? CO2 27 09/10/2021  ? GLUCOSE 91 09/10/2021  ? BUN 19 09/10/2021  ? CREATININE 0.89 09/10/2021  ? BILITOT 0.5 09/10/2021  ? ALKPHOS 81 10/24/2016  ? AST 24 09/10/2021  ? ALT 32 (H) 09/10/2021  ? PROT 6.9 09/10/2021  ? ALBUMIN 4.0 10/24/2016  ? CALCIUM 9.6 09/10/2021  ? EGFR 77 09/10/2021  ? ?Lab Results  ?Component Value Date  ? CHOL 203 (H) 01/01/2021  ? ?Lab Results  ?Component Value Date  ? HDL 41 (L) 01/01/2021  ? ?Lab Results  ?Component Value Date  ? LDLCALC 130 (H) 01/01/2021  ? ?Lab Results  ?Component Value Date  ? TRIG 181 (H) 01/01/2021  ? ?Lab Results  ?Component Value Date  ? CHOLHDL 5.0 (H)  01/01/2021  ? ?Lab Results  ?Component Value Date  ? HGBA1C 5.1 09/10/2021  ? ? ?  ?Assessment & Plan:  ? ?Problem List Items Addressed This Visit   ? ?  ? Cardiovascular and Mediastinum  ? HYPERTENSION, BENIGN - Primary  ?  Well controlled. Continue current regimen. Follow up in  6 mo  ?Encouraged her to work on routine exercise. She has been walking some.  ?  ?  ? Relevant Medications  ? lisinopril-hydrochlorothiazide (ZESTORETIC) 20-25 MG tablet  ? metoprolol tartrate (LOPRESSOR) 25 MG tablet  ? Other Relevant Orders  ? Lipid Panel w/reflex Direct LDL  ? COMPLETE METABOLIC PANEL WITH GFR  ? CBC  ? Hemoglobin A1c  ?  ? Endocrine  ? IFG (impaired fasting glucose)  ?  Due for a1C today.  Will get labs on bloodwork.   ?  ?  ? Relevant Orders  ? Lipid Panel w/reflex Direct LDL  ? COMPLETE METABOLIC PANEL WITH GFR  ? CBC  ? Hemoglobin A1c  ?  ? Other  ? Elevated liver enzymes  ?  Recheck liver enzymes.  ?  ?  ? ? ?Meds ordered this encounter  ?Medications  ? lisinopril-hydrochlorothiazide (ZESTORETIC) 20-25 MG tablet  ?  Sig: Take 1 tablet by mouth daily.  ?  Dispense:  90 tablet  ?  Refill:  1  ? metoprolol tartrate (LOPRESSOR) 25 MG tablet  ?  Sig: Take 1 tablet (25 mg total) by mouth 2 (two) times daily.  ?  Dispense:  180 tablet  ?  Refill:  1  ? ? ?Follow-up: Return in about 6 months (around 09/11/2022) for Hypertension.  ? ? ?Beatrice Lecher, MD ?

## 2022-03-12 LAB — COMPLETE METABOLIC PANEL WITH GFR
AG Ratio: 1.6 (calc) (ref 1.0–2.5)
ALT: 25 U/L (ref 6–29)
AST: 21 U/L (ref 10–35)
Albumin: 4.2 g/dL (ref 3.6–5.1)
Alkaline phosphatase (APISO): 98 U/L (ref 37–153)
BUN: 20 mg/dL (ref 7–25)
CO2: 26 mmol/L (ref 20–32)
Calcium: 9.4 mg/dL (ref 8.6–10.4)
Chloride: 101 mmol/L (ref 98–110)
Creat: 0.95 mg/dL (ref 0.50–1.03)
Globulin: 2.7 g/dL (calc) (ref 1.9–3.7)
Glucose, Bld: 84 mg/dL (ref 65–99)
Potassium: 4.1 mmol/L (ref 3.5–5.3)
Sodium: 139 mmol/L (ref 135–146)
Total Bilirubin: 0.6 mg/dL (ref 0.2–1.2)
Total Protein: 6.9 g/dL (ref 6.1–8.1)
eGFR: 72 mL/min/{1.73_m2} (ref >=60)

## 2022-03-12 LAB — CBC
HCT: 40.9 % (ref 35.0–45.0)
Hemoglobin: 13.4 g/dL (ref 11.7–15.5)
MCH: 30.1 pg (ref 27.0–33.0)
MCHC: 32.8 g/dL (ref 32.0–36.0)
MCV: 91.9 fL (ref 80.0–100.0)
MPV: 10.9 fL (ref 7.5–12.5)
Platelets: 269 10*3/uL (ref 140–400)
RBC: 4.45 10*6/uL (ref 3.80–5.10)
RDW: 12.2 % (ref 11.0–15.0)
WBC: 7.9 10*3/uL (ref 3.8–10.8)

## 2022-03-12 LAB — LIPID PANEL W/REFLEX DIRECT LDL
Cholesterol: 197 mg/dL (ref <200)
HDL: 36 mg/dL — ABNORMAL LOW (ref >=50)
LDL Cholesterol (Calc): 135 mg/dL (calc) — ABNORMAL HIGH
Non-HDL Cholesterol (Calc): 161 mg/dL (calc) — ABNORMAL HIGH (ref <130)
Total CHOL/HDL Ratio: 5.5 (calc) — ABNORMAL HIGH (ref <5.0)
Triglycerides: 133 mg/dL (ref <150)

## 2022-03-12 LAB — HEMOGLOBIN A1C
Hgb A1c MFr Bld: 5.1 % of total Hgb (ref <5.7)
Mean Plasma Glucose: 100 mg/dL
eAG (mmol/L): 5.5 mmol/L

## 2022-03-12 NOTE — Progress Notes (Signed)
Hi Selena Richard, your LDL cholesterol is mildly elevated at 135.  Glycerides look good this year.  But your HDL which is the good cholesterol has actually dropped.  Just really encourage you to continue to work on healthy diet and regular exercise to improve your numbers.  Your metabolic panel including liver enzymes look great.  Blood count is normal.  A1c looks great at 5.1.

## 2022-09-11 ENCOUNTER — Ambulatory Visit (INDEPENDENT_AMBULATORY_CARE_PROVIDER_SITE_OTHER): Payer: 59 | Admitting: Family Medicine

## 2022-09-11 ENCOUNTER — Other Ambulatory Visit: Payer: Self-pay | Admitting: Family Medicine

## 2022-09-11 ENCOUNTER — Encounter: Payer: Self-pay | Admitting: Family Medicine

## 2022-09-11 VITALS — BP 121/58 | HR 50 | Ht 66.0 in | Wt 160.0 lb

## 2022-09-11 DIAGNOSIS — I10 Essential (primary) hypertension: Secondary | ICD-10-CM

## 2022-09-11 DIAGNOSIS — Z1231 Encounter for screening mammogram for malignant neoplasm of breast: Secondary | ICD-10-CM

## 2022-09-11 DIAGNOSIS — R7301 Impaired fasting glucose: Secondary | ICD-10-CM

## 2022-09-11 LAB — POCT GLYCOSYLATED HEMOGLOBIN (HGB A1C): Hemoglobin A1C: 4.7 % (ref 4.0–5.6)

## 2022-09-11 NOTE — Assessment & Plan Note (Signed)
Well controlled. Recheck in one year.

## 2022-09-11 NOTE — Assessment & Plan Note (Addendum)
She decided to start checking pressures at home and she is noticing blood pressures under 034 systolic please let us know and we will discontinue the metoprolol.BP looks great!!! In fact if it goes lower then we will d/c the metoprolol.  F/U in 6 months.  Due BMP.

## 2022-09-11 NOTE — Progress Notes (Signed)
   Established Patient Office Visit  Subjective   Patient ID: Selena Richard, female    DOB: 1966-05-16  Age: 56 y.o. MRN: [id number]  Chief Complaint  Patient presents with   Hypertension    HPI  Hypertension- Pt denies chest pain, SOB, dizziness, or heart palpitations.  Taking meds as directed w/o problems.  Denies medication side effects.    Impaired fasting glucose-no increased thirst or urination. No symptoms consistent with hypoglycemia.  She is doing fantastic she is been on weight watchers now for a year and her weight is down to 160 BMI 25.  She is also very consistently been walking 10,000 steps per day for over 6 months.     ROS    Objective:     BP (!) 121/58   Pulse (!) 50   Ht 5\' 6"  (1.676 m)   Wt 160 lb (72.6 kg)   SpO2 100%   BMI 25.82 kg/m    Physical Exam Vitals and nursing note reviewed.  Constitutional:      Appearance: She is well-developed.  HENT:     Head: Normocephalic and atraumatic.  Cardiovascular:     Rate and Rhythm: Normal rate and regular rhythm.     Heart sounds: Normal heart sounds.  Pulmonary:     Effort: Pulmonary effort is normal.     Breath sounds: Normal breath sounds.  Skin:    General: Skin is warm and dry.  Neurological:     Mental Status: She is alert and oriented to person, place, and time.  Psychiatric:        Behavior: Behavior normal.      Results for orders placed or performed in visit on 09/11/22  POCT glycosylated hemoglobin (Hb A1C)  Result Value Ref Range   Hemoglobin A1C 4.7 4.0 - 5.6 %   HbA1c POC (<> result, manual entry)     HbA1c, POC (prediabetic range)     HbA1c, POC (controlled diabetic range)        The 10-year ASCVD risk score (Arnett DK, et al., 2019) is: 3.3%    Assessment & Plan:   Problem List Items Addressed This Visit       Cardiovascular and Mediastinum   HYPERTENSION, BENIGN - Primary    She decided to start checking pressures at home and she is noticing blood  pressures under 397 systolic please let us know and we will discontinue the metoprolol.BP looks great!!! In fact if it goes lower then we will d/c the metoprolol.  F/U in 6 months.  Due BMP.       Relevant Orders   BASIC METABOLIC PANEL WITH GFR     Endocrine   IFG (impaired fasting glucose)    Well controlled. Recheck in one year.       Relevant Orders   POCT glycosylated hemoglobin (Hb A1C) (Completed)    Return in about 6 months (around 03/17/2023) for Hypertension.    Beatrice Lecher, MD

## 2022-09-12 LAB — BASIC METABOLIC PANEL WITH GFR
BUN: 23 mg/dL (ref 7–25)
CO2: 28 mmol/L (ref 20–32)
Calcium: 10.1 mg/dL (ref 8.6–10.4)
Chloride: 101 mmol/L (ref 98–110)
Creat: 0.8 mg/dL (ref 0.50–1.03)
Glucose, Bld: 89 mg/dL (ref 65–99)
Potassium: 5 mmol/L (ref 3.5–5.3)
Sodium: 138 mmol/L (ref 135–146)
eGFR: 88 mL/min/{1.73_m2} (ref >=60)

## 2022-09-12 NOTE — Progress Notes (Signed)
Your lab work is within acceptable range and there are no concerning findings.   ?

## 2022-09-23 ENCOUNTER — Other Ambulatory Visit: Payer: Self-pay | Admitting: Family Medicine

## 2022-09-23 DIAGNOSIS — I10 Essential (primary) hypertension: Secondary | ICD-10-CM

## 2022-09-26 ENCOUNTER — Ambulatory Visit (INDEPENDENT_AMBULATORY_CARE_PROVIDER_SITE_OTHER): Payer: 59

## 2022-09-26 DIAGNOSIS — Z1231 Encounter for screening mammogram for malignant neoplasm of breast: Secondary | ICD-10-CM | POA: Diagnosis not present

## 2022-09-27 NOTE — Progress Notes (Signed)
Please call patient. Normal mammogram.  Repeat in 1 year.  

## 2023-03-12 ENCOUNTER — Ambulatory Visit: Payer: 59 | Admitting: Family Medicine

## 2023-03-20 ENCOUNTER — Other Ambulatory Visit: Payer: Self-pay | Admitting: Family Medicine

## 2023-03-20 DIAGNOSIS — I10 Essential (primary) hypertension: Secondary | ICD-10-CM

## 2023-04-03 ENCOUNTER — Ambulatory Visit (INDEPENDENT_AMBULATORY_CARE_PROVIDER_SITE_OTHER): Payer: 59 | Admitting: Family Medicine

## 2023-04-03 ENCOUNTER — Encounter: Payer: Self-pay | Admitting: Family Medicine

## 2023-04-03 VITALS — BP 128/62 | HR 51 | Ht 66.0 in | Wt 153.0 lb

## 2023-04-03 DIAGNOSIS — I10 Essential (primary) hypertension: Secondary | ICD-10-CM

## 2023-04-03 DIAGNOSIS — E785 Hyperlipidemia, unspecified: Secondary | ICD-10-CM

## 2023-04-03 DIAGNOSIS — R7301 Impaired fasting glucose: Secondary | ICD-10-CM

## 2023-04-03 DIAGNOSIS — R748 Abnormal levels of other serum enzymes: Secondary | ICD-10-CM

## 2023-04-03 DIAGNOSIS — Z1211 Encounter for screening for malignant neoplasm of colon: Secondary | ICD-10-CM

## 2023-04-03 LAB — POCT GLYCOSYLATED HEMOGLOBIN (HGB A1C): Hemoglobin A1C: 4.8 % (ref 4.0–5.6)

## 2023-04-03 NOTE — Assessment & Plan Note (Signed)
Well controlled. Continue current regimen. Follow up in  12 mo  

## 2023-04-03 NOTE — Progress Notes (Signed)
Established Patient Office Visit  Subjective   Patient ID: Selena Richard, female    DOB: 1966-08-15  Age: 57 y.o. MRN: [id number]  Chief Complaint  Patient presents with   Hypertension    HPI  Hypertension- Pt denies chest pain, SOB, dizziness, or heart palpitations.  Taking meds as directed w/o problems.  Denies medication side effects.    Impaired fasting glucose-no increased thirst or urination. No symptoms consistent with hypoglycemia.  BMI down to 24 and she has been steady for about 5 months. She is just trying to maintain at this point.  She is getting in 10,000 steps per day and has been thinking about adding in some resistance training.  Still working on healthy diet.     ROS    Objective:     BP 128/62   Pulse (!) 51   Ht 5\' 6"  (1.676 m)   Wt 153 lb (69.4 kg)   SpO2 100%   BMI 24.69 kg/m    Physical Exam Vitals and nursing note reviewed.  Constitutional:      Appearance: She is well-developed.  HENT:     Head: Normocephalic and atraumatic.  Cardiovascular:     Rate and Rhythm: Normal rate and regular rhythm.     Heart sounds: Normal heart sounds.  Pulmonary:     Effort: Pulmonary effort is normal.     Breath sounds: Normal breath sounds.  Skin:    General: Skin is warm and dry.  Neurological:     Mental Status: She is alert and oriented to person, place, and time.  Psychiatric:        Behavior: Behavior normal.      Results for orders placed or performed in visit on 04/03/23  POCT glycosylated hemoglobin (Hb A1C)  Result Value Ref Range   Hemoglobin A1C 4.8 4.0 - 5.6 %   HbA1c POC (<> result, manual entry)     HbA1c, POC (prediabetic range)     HbA1c, POC (controlled diabetic range)        The 10-year ASCVD risk score (Arnett DK, et al., 2019) is: 3.7%    Assessment & Plan:   Problem List Items Addressed This Visit       Cardiovascular and Mediastinum   HYPERTENSION, BENIGN - Primary    Well controlled. Continue current  regimen. Follow up in  6 mo       Relevant Orders   COMPLETE METABOLIC PANEL WITH GFR   Lipid Panel w/reflex Direct LDL     Endocrine   IFG (impaired fasting glucose)    Well controlled. Continue current regimen. Follow up in  12 mo       Relevant Orders   COMPLETE METABOLIC PANEL WITH GFR   Lipid Panel w/reflex Direct LDL   POCT glycosylated hemoglobin (Hb A1C) (Completed)     Other   Hyperlipidemia    Due to recheck lipids.        Relevant Orders   COMPLETE METABOLIC PANEL WITH GFR   Lipid Panel w/reflex Direct LDL   Elevated liver enzymes   Relevant Orders   COMPLETE METABOLIC PANEL WITH GFR   Lipid Panel w/reflex Direct LDL   Other Visit Diagnoses     Screen for colon cancer       Relevant Orders   Cologuard      Discussed options for colon cancer screening. Ok to do cologuard.   Return in about 6 months (around 10/03/2023) for Hypertension.    Nani Gasser,  MD

## 2023-04-03 NOTE — Assessment & Plan Note (Signed)
Well controlled. Continue current regimen. Follow up in  6 mo  

## 2023-04-03 NOTE — Assessment & Plan Note (Signed)
Due to recheck lipids. 

## 2023-04-04 LAB — LIPID PANEL W/REFLEX DIRECT LDL
Cholesterol: 190 mg/dL (ref <200)
HDL: 41 mg/dL — ABNORMAL LOW (ref >=50)
LDL Cholesterol (Calc): 126 mg/dL (calc) — ABNORMAL HIGH
Non-HDL Cholesterol (Calc): 149 mg/dL (calc) — ABNORMAL HIGH (ref <130)
Total CHOL/HDL Ratio: 4.6 (calc) (ref <5.0)
Triglycerides: 120 mg/dL (ref <150)

## 2023-04-04 LAB — COMPLETE METABOLIC PANEL WITH GFR
AG Ratio: 1.5 (calc) (ref 1.0–2.5)
ALT: 27 U/L (ref 6–29)
AST: 24 U/L (ref 10–35)
Albumin: 4.1 g/dL (ref 3.6–5.1)
Alkaline phosphatase (APISO): 78 U/L (ref 37–153)
BUN: 25 mg/dL (ref 7–25)
CO2: 28 mmol/L (ref 20–32)
Calcium: 9.6 mg/dL (ref 8.6–10.4)
Chloride: 103 mmol/L (ref 98–110)
Creat: 0.7 mg/dL (ref 0.50–1.03)
Globulin: 2.7 g/dL (calc) (ref 1.9–3.7)
Glucose, Bld: 79 mg/dL (ref 65–99)
Potassium: 4.8 mmol/L (ref 3.5–5.3)
Sodium: 139 mmol/L (ref 135–146)
Total Bilirubin: 0.5 mg/dL (ref 0.2–1.2)
Total Protein: 6.8 g/dL (ref 6.1–8.1)
eGFR: 103 mL/min/{1.73_m2} (ref >=60)

## 2023-04-06 NOTE — Progress Notes (Signed)
HI Nautia, your LDL cholesterol is better than last year. Not at goal but improving!!!! Metabolic panel looks great!

## 2023-04-28 ENCOUNTER — Ambulatory Visit (INDEPENDENT_AMBULATORY_CARE_PROVIDER_SITE_OTHER): Payer: 59 | Admitting: Physician Assistant

## 2023-04-28 ENCOUNTER — Encounter: Payer: Self-pay | Admitting: Family Medicine

## 2023-04-28 ENCOUNTER — Encounter: Payer: Self-pay | Admitting: Physician Assistant

## 2023-04-28 VITALS — BP 147/79 | HR 63 | Ht 66.0 in | Wt 152.0 lb

## 2023-04-28 DIAGNOSIS — R001 Bradycardia, unspecified: Secondary | ICD-10-CM | POA: Diagnosis not present

## 2023-04-28 DIAGNOSIS — R55 Syncope and collapse: Secondary | ICD-10-CM

## 2023-04-28 DIAGNOSIS — I951 Orthostatic hypotension: Secondary | ICD-10-CM | POA: Insufficient documentation

## 2023-04-28 LAB — CBC WITH DIFFERENTIAL/PLATELET
Basophils Absolute: 48 cells/uL (ref 0–200)
Basophils Relative: 0.5 %
Hemoglobin: 14 g/dL (ref 11.7–15.5)
MCV: 92 fL (ref 80.0–100.0)
MPV: 11.5 fL (ref 7.5–12.5)
Neutro Abs: 7526 cells/uL (ref 1500–7800)
RBC: 4.61 10*6/uL (ref 3.80–5.10)
RDW: 12.2 % (ref 11.0–15.0)
Total Lymphocyte: 14.4 %

## 2023-04-28 NOTE — Progress Notes (Unsigned)
   Acute Office Visit  Subjective:     Patient ID: Selena Richard, female    DOB: November 30, 1966, 57 y.o.   MRN: [id number]  No chief complaint on file.   HPI Patient is in today for *** Intermitt Started Friday Near syncope No sickness Any changes with medication none HR 50s  No headaches Lost weight over the past 2 years 75lbs.      ROS  See HPI.     Objective:    There were no vitals taken for this visit. BP Readings from Last 3 Encounters:  04/28/23 (!) 147/79  04/03/23 128/62  09/11/22 (!) 121/58   Wt Readings from Last 3 Encounters:  04/28/23 152 lb (68.9 kg)  04/03/23 153 lb (69.4 kg)  09/11/22 160 lb (72.6 kg)    .Marland KitchenOrthostatic VS for the past 24 hrs (Last 3 readings):  BP- Lying Pulse- Lying BP- Sitting Pulse- Sitting BP- Standing at 3 minutes Pulse- Standing at 3 minutes  04/28/23 1316 145/65 56 137/66 51 127/61 53     Physical Exam        Assessment & Plan:  Marland KitchenMarland KitchenArdes was seen today for dizziness.  Diagnoses and all orders for this visit:  Orthostatic hypotension  Near syncope -     TSH -     CBC w/Diff/Platelet -     BASIC METABOLIC PANEL WITH GFR -     EKG 12-Lead  Sinus bradycardia   Stop metoprolol due to near syncope and sinus bradycardia Get labs for evaluation EKG shows   No follow-ups on file.  Tandy Gaw, PA-C

## 2023-04-28 NOTE — Patient Instructions (Addendum)
Order labs Cartoid artery ultrasound Start ASa 81mg  daily  B: Balance. Do they have loss of balance or are they dizzy? ... E: Eyes. Can they see out of both eyes OK? ... F: Face. Does one side of their face look uneven or like it is drooping? ... A: Arm. ... S: Speech. ... T: Time to call 911.   Orthostatic Hypotension Blood pressure is a measurement of how strongly, or weakly, your circulating blood is pressing against the walls of your arteries. Orthostatic hypotension is a drop in blood pressure that can happen when you change positions, such as when you go from lying down to standing. Arteries are blood vessels that carry blood from your heart throughout your body. When blood pressure is too low, you may not get enough blood to your brain or to the rest of your organs. Orthostatic hypotension can cause light-headedness, sweating, rapid heartbeat, blurred vision, and fainting. These symptoms require further investigation into the cause. What are the causes? Orthostatic hypotension can be caused by many things, including: Sudden changes in posture, such as standing up quickly after you have been sitting or lying down. Loss of blood (anemia) or loss of body fluids (dehydration). Heart problems, neurologic problems, or hormone problems. Pregnancy. Aging. The risk for this condition increases as you get older. Severe infection (sepsis). Certain medicines, such as medicines for high blood pressure or medicines that make the body lose excess fluids (diuretics). What are the signs or symptoms? Symptoms of this condition may include: Weakness, light-headedness, or dizziness. Sweating. Blurred vision. Tiredness (fatigue). Rapid heartbeat. Fainting, in severe cases. How is this diagnosed? This condition is diagnosed based on: Your symptoms and medical history. Your blood pressure measurements. Your health care provider will check your blood pressure when you are: Lying  down. Sitting. Standing. A blood pressure reading is recorded as two numbers, such as "120 over 80" (or 120/80). The first ("top") number is called the systolic pressure. It is a measure of the pressure in your arteries as your heart beats. The second ("bottom") number is called the diastolic pressure. It is a measure of the pressure in your arteries when your heart relaxes between beats. Blood pressure is measured in a unit called mmHg. Healthy blood pressure for most adults is 120/80 mmHg. Orthostatic hypotension is defined as a 20 mmHg drop in systolic pressure or a 10 mmHg drop in diastolic pressure within 3 minutes of standing. Other information or tests that may be used to diagnose orthostatic hypotension include: Your other vital signs, such as your heart rate and temperature. Blood tests. An electrocardiogram (ECG) or echocardiogram. A Holter monitor. This is a device you wear that records your heart rhythm continuously, usually for 24-48 hours. Tilt table test. For this test, you will be safely secured to a table that moves you from a lying position to an upright position. Your heart rhythm and blood pressure will be monitored during the test. How is this treated? This condition may be treated by: Changing your diet. This may involve eating more salt (sodium) or drinking more water. Changing the dosage of certain medicines you are taking that might be lowering your blood pressure. Correcting the underlying reason for the orthostatic hypotension. Wearing compression stockings. Taking medicines to raise your blood pressure. Avoiding actions that trigger symptoms. Follow these instructions at home: Medicines Take over-the-counter and prescription medicines only as told by your health care provider. Follow instructions from your health care provider about changing the dosage of your current  medicines, if this applies. Do not stop or adjust any of your medicines on your own. Eating and  drinking  Drink enough fluid to keep your urine pale yellow. Eat extra salt only as directed. Do not add extra salt to your diet unless advised by your health care provider. Eat frequent, small meals. Avoid standing up suddenly after eating. General instructions  Get up slowly from lying down or sitting positions. This gives your blood pressure a chance to adjust. Avoid hot showers and excessive heat as directed by your health care provider. Engage in regular physical activity as directed by your health care provider. If you have compression stockings, wear them as told. Keep all follow-up visits. This is important. Contact a health care provider if: You have a fever for more than 2-3 days. You feel more thirsty than usual. You feel dizzy or weak. Get help right away if: You have chest pain. You have a fast or irregular heartbeat. You become sweaty or feel light-headed. You feel short of breath. You faint. You have any symptoms of a stroke. "BE FAST" is an easy way to remember the main warning signs of a stroke: B - Balance. Signs are dizziness, sudden trouble walking, or loss of balance. E - Eyes. Signs are trouble seeing or a sudden change in vision. F - Face. Signs are sudden weakness or numbness of the face, or the face or eyelid drooping on one side. A - Arms. Signs are weakness or numbness in an arm. This happens suddenly and usually on one side of the body. S - Speech. Signs are sudden trouble speaking, slurred speech, or trouble understanding what people say. T - Time. Time to call emergency services. Write down what time symptoms started. You have other signs of a stroke, such as: A sudden, severe headache with no known cause. Nausea or vomiting. Seizure. These symptoms may represent a serious problem that is an emergency. Do not wait to see if the symptoms will go away. Get medical help right away. Call your local emergency services (911 in the [place] Do not drive yourself  to the hospital. Summary Orthostatic hypotension is a sudden drop in blood pressure. It can cause light-headedness, sweating, rapid heartbeat, blurred vision, and fainting. Orthostatic hypotension can be diagnosed by having your blood pressure taken while lying down, sitting, and then standing. Treatment may involve changing your diet, wearing compression stockings, sitting up slowly, adjusting your medicines, or correcting the underlying reason for the orthostatic hypotension. Get help right away if you have chest pain, a fast or irregular heartbeat, or symptoms of a stroke. This information is not intended to replace advice given to you by your health care provider. Make sure you discuss any questions you have with your health care provider. Document Revised: 02/22/2021 Document Reviewed: 02/22/2021 Elsevier Patient Education  2023 [employer].

## 2023-04-29 ENCOUNTER — Encounter: Payer: Self-pay | Admitting: Physician Assistant

## 2023-04-29 LAB — CBC WITH DIFFERENTIAL/PLATELET
Absolute Monocytes: 557 cells/uL (ref 200–950)
Eosinophils Absolute: 86 cells/uL (ref 15–500)
Eosinophils Relative: 0.9 %
HCT: 42.4 % (ref 35.0–45.0)
Lymphs Abs: 1382 cells/uL (ref 850–3900)
MCH: 30.4 pg (ref 27.0–33.0)
MCHC: 33 g/dL (ref 32.0–36.0)
Monocytes Relative: 5.8 %
Neutrophils Relative %: 78.4 %
Platelets: 246 10*3/uL (ref 140–400)
WBC: 9.6 10*3/uL (ref 3.8–10.8)

## 2023-04-29 LAB — BASIC METABOLIC PANEL WITH GFR
BUN: 23 mg/dL (ref 7–25)
CO2: 27 mmol/L (ref 20–32)
Calcium: 10.1 mg/dL (ref 8.6–10.4)
Chloride: 105 mmol/L (ref 98–110)
Creat: 0.66 mg/dL (ref 0.50–1.03)
Glucose, Bld: 77 mg/dL (ref 65–99)
Potassium: 3.7 mmol/L (ref 3.5–5.3)
Sodium: 143 mmol/L (ref 135–146)
eGFR: 104 mL/min/{1.73_m2} (ref >=60)

## 2023-04-29 LAB — TSH: TSH: 3.08 mIU/L

## 2023-04-29 NOTE — Progress Notes (Signed)
Labs in normal range. Nothing to suggest a cause in lightheadedness.

## 2023-05-01 ENCOUNTER — Ambulatory Visit (INDEPENDENT_AMBULATORY_CARE_PROVIDER_SITE_OTHER): Payer: 59

## 2023-05-01 DIAGNOSIS — R55 Syncope and collapse: Secondary | ICD-10-CM | POA: Diagnosis not present

## 2023-05-01 DIAGNOSIS — R42 Dizziness and giddiness: Secondary | ICD-10-CM

## 2023-05-02 LAB — COLOGUARD: COLOGUARD: NEGATIVE

## 2023-05-02 NOTE — Progress Notes (Signed)
Great news! Your Cologuard test is negative.  Recommend repeat colon cancer screening in 3 years.

## 2023-05-05 ENCOUNTER — Ambulatory Visit (INDEPENDENT_AMBULATORY_CARE_PROVIDER_SITE_OTHER): Payer: 59 | Admitting: Physician Assistant

## 2023-05-05 ENCOUNTER — Encounter: Payer: Self-pay | Admitting: Physician Assistant

## 2023-05-05 VITALS — BP 150/73 | HR 69 | Ht 66.0 in | Wt 152.0 lb

## 2023-05-05 DIAGNOSIS — R001 Bradycardia, unspecified: Secondary | ICD-10-CM | POA: Diagnosis not present

## 2023-05-05 DIAGNOSIS — I10 Essential (primary) hypertension: Secondary | ICD-10-CM | POA: Diagnosis not present

## 2023-05-05 DIAGNOSIS — E041 Nontoxic single thyroid nodule: Secondary | ICD-10-CM

## 2023-05-05 DIAGNOSIS — R55 Syncope and collapse: Secondary | ICD-10-CM

## 2023-05-05 DIAGNOSIS — I951 Orthostatic hypotension: Secondary | ICD-10-CM

## 2023-05-05 NOTE — Progress Notes (Signed)
Reviewed with patient at [facility] on 05/05/2023.

## 2023-05-05 NOTE — Progress Notes (Unsigned)
Established Patient Office Visit  Subjective   Patient ID: Selena Richard, female    DOB: 03/28/1966  Age: 57 y.o. MRN: [id number]  Chief Complaint  Patient presents with   Follow-up    HPI Pt is a 57 yo female who presents to the clinic for 1 week follow up on near syncope and testing.   Her BP has been better since stopping metoprolol and only had one "event" since change. HR 60-80. Home BP 120s over 70s. When she has "event" she feels it in her right arm like a tingling/numb feeling.   Carotid U/S done:     IMPRESSION: 1. Minimal amount of bilateral atherosclerotic plaque, not resulting in a hemodynamically significant stenosis within either internal carotid artery. 2. Elevated peak systolic velocities within the distal aspect of the left subclavian artery without asymmetry between the left and right upper extremity blood pressure measurements. Clinical correlation is advised. Further evaluation with CTA could be performed as indicated. 3. Approximately 4.5 cm isoechoic nodule/mass replacing near the entirety of the right lobe of the thyroid. Further evaluation with dedicated thyroid ultrasound could be performed as indicated.  .. Active Ambulatory Problems    Diagnosis Date Noted   Hyperlipidemia 05/23/2008   HYPERTENSION, BENIGN 05/18/2008   IFG (impaired fasting glucose) 02/22/2013   Turner syndrome 05/01/2015   Elevated liver enzymes 09/10/2021   Near syncope 04/28/2023   Sinus bradycardia 04/28/2023   Orthostatic hypotension 04/28/2023   Right thyroid nodule 05/06/2023   Resolved Ambulatory Problems    Diagnosis Date Noted   BLOOD CHEMISTRY, ABNORMAL 04/04/2009   Past Medical History:  Diagnosis Date   Hypertension    Primary ovarian failure      ROS See HPI.    Objective:     There were no vitals taken for this visit. BP Readings from Last 3 Encounters:  05/05/23 (!) 150/73  04/28/23 (!) 147/79  04/03/23 128/62   Wt Readings from Last  3 Encounters:  05/05/23 152 lb (68.9 kg)  04/28/23 152 lb (68.9 kg)  04/03/23 153 lb (69.4 kg)     Physical Exam Constitutional:      Appearance: Normal appearance.  HENT:     Head: Normocephalic.  Cardiovascular:     Rate and Rhythm: Normal rate and regular rhythm.     Pulses: Normal pulses.     Heart sounds: Normal heart sounds.  Pulmonary:     Effort: Pulmonary effort is normal.  Neurological:     General: No focal deficit present.     Mental Status: She is alert and oriented to person, place, and time.  Psychiatric:        Mood and Affect: Mood normal.       The 10-year ASCVD risk score (Arnett DK, et al., 2019) is: 4.1%    Assessment & Plan:  Marland KitchenMarland KitchenDema was seen today for follow-up.  Diagnoses and all orders for this visit:  Right thyroid nodule -     US THYROID; Future  Near syncope -     CT Angio Chest W/Cm &/Or Wo Cm; Future  Sinus bradycardia -     CT Angio Chest W/Cm &/Or Wo Cm; Future  Orthostatic hypotension -     CT Angio Chest W/Cm &/Or Wo Cm; Future  HYPERTENSION, BENIGN -     CT Angio Chest W/Cm &/Or Wo Cm; Future   Discussed results of U/S carotid No significant stenosis [place] of thyroid order for nodule CTA ordered for abnormal subclavian velocity Orthostatic  readings better and not diagnostic at this time Reassuring near syncope is improving  BP in office elevated but GREAT home readings.   Spent 38 minutes with patient reviewing chart, discussing labs and coordinating care.    Tandy Gaw, PA-C

## 2023-05-06 ENCOUNTER — Encounter: Payer: Self-pay | Admitting: Physician Assistant

## 2023-05-06 DIAGNOSIS — E041 Nontoxic single thyroid nodule: Secondary | ICD-10-CM | POA: Insufficient documentation

## 2023-05-08 ENCOUNTER — Ambulatory Visit (INDEPENDENT_AMBULATORY_CARE_PROVIDER_SITE_OTHER): Payer: 59

## 2023-05-08 DIAGNOSIS — E041 Nontoxic single thyroid nodule: Secondary | ICD-10-CM | POA: Diagnosis not present

## 2023-05-09 ENCOUNTER — Other Ambulatory Visit: Payer: Self-pay | Admitting: Physician Assistant

## 2023-05-09 DIAGNOSIS — E041 Nontoxic single thyroid nodule: Secondary | ICD-10-CM

## 2023-05-09 NOTE — Progress Notes (Signed)
Right thyroid nodule is about 4.4cm and does meet criteria for biopsy. I have placed order and should be contacted soon to schedule.

## 2023-05-16 ENCOUNTER — Ambulatory Visit (INDEPENDENT_AMBULATORY_CARE_PROVIDER_SITE_OTHER): Payer: 59

## 2023-05-16 DIAGNOSIS — I10 Essential (primary) hypertension: Secondary | ICD-10-CM

## 2023-05-16 DIAGNOSIS — R55 Syncope and collapse: Secondary | ICD-10-CM | POA: Diagnosis not present

## 2023-05-16 DIAGNOSIS — R001 Bradycardia, unspecified: Secondary | ICD-10-CM | POA: Diagnosis not present

## 2023-05-16 DIAGNOSIS — I951 Orthostatic hypotension: Secondary | ICD-10-CM

## 2023-05-16 MED ORDER — IOHEXOL 350 MG/ML SOLN
100.0000 mL | Freq: Once | INTRAVENOUS | Status: AC | PRN
Start: 2023-05-16 — End: 2023-05-16
  Administered 2023-05-16: 64 mL via INTRAVENOUS

## 2023-05-21 ENCOUNTER — Encounter: Payer: Self-pay | Admitting: Family Medicine

## 2023-05-21 DIAGNOSIS — I771 Stricture of artery: Secondary | ICD-10-CM

## 2023-05-21 NOTE — Progress Notes (Signed)
HI Selena Richard, we got your CT back.  It shows that the subclavian artery is more narrow than it would be. This could have caused the numbness and tingling you had in your arm when you BP dropped.  I would like to refer you to a vascular specialist to see fi they would recommend any specific treatment, etc.  Do you have a preference for [facility] or [facility]?

## 2023-06-12 ENCOUNTER — Other Ambulatory Visit (HOSPITAL_COMMUNITY)
Admission: RE | Admit: 2023-06-12 | Discharge: 2023-06-12 | Disposition: A | Discharge status: Home / Self Care | Payer: 59 | Source: Ambulatory Visit | Attending: Physician Assistant | Admitting: Physician Assistant

## 2023-06-12 ENCOUNTER — Ambulatory Visit
Admission: RE | Admit: 2023-06-12 | Discharge: 2023-06-12 | Disposition: A | Discharge status: Home / Self Care | Payer: 59 | Source: Ambulatory Visit | Attending: Physician Assistant | Admitting: Physician Assistant

## 2023-06-12 DIAGNOSIS — E041 Nontoxic single thyroid nodule: Secondary | ICD-10-CM | POA: Insufficient documentation

## 2023-06-13 ENCOUNTER — Encounter: Payer: Self-pay | Admitting: Surgery

## 2023-06-13 ENCOUNTER — Ambulatory Visit (INDEPENDENT_AMBULATORY_CARE_PROVIDER_SITE_OTHER): Payer: 59 | Admitting: Surgery

## 2023-06-13 VITALS — BP 132/80 | HR 82 | Temp 98.0°F | Resp 18 | Ht 66.0 in | Wt 153.0 lb

## 2023-06-13 DIAGNOSIS — I771 Stricture of artery: Secondary | ICD-10-CM

## 2023-06-13 NOTE — Progress Notes (Signed)
[facility] [place]  Patient name: Selena Richard MRN: [id number] DOB: [birth year] Sex: female   REQUESTING PROVIDER:    Dr. Eppie Gibson   REASON FOR CONSULT:    Subclavian stenosis  HISTORY OF PRESENT ILLNESS:   Selena Richard is a 57 y.o. 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  Back in May, she had an episode of dizziness as well as right arm numbness.  She had several episodes that did not last very long.  This led to a carotid ultrasound that showed no significant extracranial carotid disease but did show a left subclavian stenosis.  This was further evaluated with a PE protocol CT scan of the chest.  She denies any weakness in her left arm.  She denies vertebrobasilar insufficiency symptoms.  She does have slightly asymmetric blood pressures.   Selena Richard is medically managed for hypertension, which has been well-controlled.  She is a non-smoker.  She does have impaired fasting glucose testing  PAST MEDICAL HISTORY    Past Medical History:  Diagnosis Date   Hypertension    Primary ovarian failure      FAMILY HISTORY   Family History  Problem Relation Age of Onset   Hypertension Father    Atrial fibrillation Father    Colon cancer Maternal Grandfather        around age 14    SOCIAL HISTORY:   Social History   Socioeconomic History   Marital status: Married    Spouse name: Not on file   Number of children: Not on file   Years of education: Not on file   Highest education level: Associate degree: academic program  Occupational History   Not on file  Tobacco Use   Smoking status: Never   Smokeless tobacco: Never  Substance and Sexual Activity   Alcohol use: No   Drug use: No   Sexual activity: Not on file  Other Topics Concern   Not on file  Social History Narrative   Not on file   Social Determinants of Health   Financial Resource Strain: Low Risk   (04/02/2023)   Overall Financial Resource Strain (CARDIA)    Difficulty of Paying Living Expenses: Not very hard  Food Insecurity: No Food Insecurity (04/02/2023)   Hunger Vital Sign    Worried About Running Out of Food in the Last Year: Never true    Ran Out of Food in the Last Year: Never true  Transportation Needs: No Transportation Needs (04/02/2023)   PRAPARE - Administrator, Civil Service (Medical): No    Lack of Transportation (Non-Medical): No  Physical Activity: Unknown (04/02/2023)   Exercise Vital Sign    Days of Exercise per Week: 0 days    Minutes of Exercise per Session: Not on file  Stress: No Stress Concern Present (04/02/2023)   [employer] of Occupational Health - Occupational Stress Questionnaire    Feeling of Stress : Not at all  Social Connections: Moderately Integrated (04/02/2023)   Social Connection and Isolation Panel [NHANES]    Frequency of Communication with Friends and Family: More than three times a week    Frequency of Social Gatherings with Friends and Family: Once a week    Attends Religious Services: 1 to 4 times per year    Active Member of [employer] or Organizations: No    Attends Engineer, structural: Not on file    Marital Status: Married  Catering manager Violence: Not  on file    ALLERGIES:    No Known Allergies  CURRENT MEDICATIONS:    Current Outpatient Medications  Medication Sig Dispense Refill   aspirin 81 MG chewable tablet 81 mg daily.     lisinopril-hydrochlorothiazide (ZESTORETIC) 20-25 MG tablet TAKE 1 TABLET BY MOUTH EVERY DAY 30 tablet 5   No current facility-administered medications for this visit.    REVIEW OF SYSTEMS:   [X]  denotes positive finding, [ ]  denotes negative finding Cardiac  Comments:  Chest pain or chest pressure:    Shortness of breath upon exertion:    Short of breath when lying flat:    Irregular heart rhythm:        Vascular    Pain in calf, thigh, or hip brought on by  ambulation:    Pain in feet at night that wakes you up from your sleep:     Blood clot in your veins:    Leg swelling:         Pulmonary    Oxygen at home:    Productive cough:     Wheezing:         Neurologic    Sudden weakness in arms or legs:     Sudden numbness in arms or legs:  x   Sudden onset of difficulty speaking or slurred speech:    Temporary loss of vision in one eye:     Problems with dizziness:         Gastrointestinal    Blood in stool:      Vomited blood:         Genitourinary    Burning when urinating:     Blood in urine:        Psychiatric    Major depression:         Hematologic    Bleeding problems:    Problems with blood clotting too easily:        Skin    Rashes or ulcers:        Constitutional    Fever or chills:     PHYSICAL EXAM:   Vitals:   06/13/23 0956 06/13/23 0957  BP: (!) 142/77 132/80  Pulse: 82   Resp: 18   Temp: 98 F (36.7 C)   TempSrc: Temporal   SpO2: 100%   Weight: 153 lb (69.4 kg)   Height: 5\' 6"  (1.676 m)     GENERAL: The patient is a well-nourished female, in no acute distress. The vital signs are documented above. CARDIAC: There is a regular rate and rhythm.  VASCULAR: Easily palpable bilateral radial pulses PULMONARY: Nonlabored respirations MUSCULOSKELETAL: There are no major deformities or cyanosis. NEUROLOGIC: No focal weakness or paresthesias are detected. SKIN: There are no ulcers or rashes noted. PSYCHIATRIC: The patient has a normal affect.  STUDIES:   I have reviewed the following ultrasound: 1. Minimal amount of bilateral atherosclerotic plaque, not resulting in a hemodynamically significant stenosis within either internal carotid artery. 2. Elevated peak systolic velocities within the distal aspect of the left subclavian artery without asymmetry between the left and right upper extremity blood pressure measurements. Clinical correlation is advised. Further evaluation with CTA could be  performed as indicated. 3. Approximately 4.5 cm isoechoic nodule/mass replacing near the entirety of the right lobe of the thyroid. Further evaluation with dedicated thyroid ultrasound could be performed as indicated.    ASSESSMENT and PLAN   Left subclavian artery stenosis: Although this is high-grade, I do not feel that this is  symptomatic.  She does not have any signs of vertebrobasilar insufficiency.  She does not have claudication symptoms in her left arm.  I would not recommend any interventional treatment for this currently.  We did discuss signs and symptoms to watch out for.  I told her that she needs to check her blood pressure in her right arm because the left arm will be lower and not as accurate.  Today, there is a 10 mm gradient.  She should be taking a baby aspirin.  Her LDL cholesterol is 126 and so I would recommend starting a statin but I will defer that back to Dr. Linford Arnold.  I will have her follow-up with me in 1 year with repeat carotid ultrasound and subclavian evaluation   Durene Cal, IV, MD, FACS [facility] [phone] Pager [phone]

## 2023-06-16 ENCOUNTER — Encounter: Payer: 59 | Admitting: Surgery

## 2023-06-16 LAB — CYTOLOGY - NON PAP

## 2023-06-17 NOTE — Progress Notes (Signed)
GREAT news! Biopsy came back benign follicular nodule!

## 2023-06-21 ENCOUNTER — Other Ambulatory Visit: Payer: Self-pay

## 2023-06-21 DIAGNOSIS — I771 Stricture of artery: Secondary | ICD-10-CM

## 2023-09-26 ENCOUNTER — Other Ambulatory Visit: Payer: Self-pay | Admitting: Family Medicine

## 2023-09-26 DIAGNOSIS — I10 Essential (primary) hypertension: Secondary | ICD-10-CM

## 2023-10-06 ENCOUNTER — Ambulatory Visit: Payer: 59 | Admitting: Family Medicine

## 2024-04-09 ENCOUNTER — Other Ambulatory Visit: Payer: Self-pay | Admitting: Family Medicine

## 2024-04-09 DIAGNOSIS — I10 Essential (primary) hypertension: Secondary | ICD-10-CM

## 2024-04-26 ENCOUNTER — Ambulatory Visit: Admitting: Family Medicine

## 2024-06-23 ENCOUNTER — Ambulatory Visit: Admitting: Family Medicine

## 2024-07-30 ENCOUNTER — Other Ambulatory Visit (HOSPITAL_BASED_OUTPATIENT_CLINIC_OR_DEPARTMENT_OTHER): Payer: Self-pay | Admitting: Family Medicine

## 2024-07-30 DIAGNOSIS — Z1231 Encounter for screening mammogram for malignant neoplasm of breast: Secondary | ICD-10-CM

## 2024-08-05 ENCOUNTER — Ambulatory Visit: Admitting: Family Medicine

## 2024-08-09 ENCOUNTER — Inpatient Hospital Stay (HOSPITAL_BASED_OUTPATIENT_CLINIC_OR_DEPARTMENT_OTHER): Admission: RE | Admit: 2024-08-09 | Source: Ambulatory Visit

## 2024-08-10 ENCOUNTER — Encounter (HOSPITAL_BASED_OUTPATIENT_CLINIC_OR_DEPARTMENT_OTHER): Payer: Self-pay

## 2024-08-10 ENCOUNTER — Ambulatory Visit (HOSPITAL_BASED_OUTPATIENT_CLINIC_OR_DEPARTMENT_OTHER)
Admission: RE | Admit: 2024-08-10 | Discharge: 2024-08-10 | Disposition: A | Discharge status: Home / Self Care | Source: Ambulatory Visit | Attending: Family Medicine | Admitting: Family Medicine

## 2024-08-10 DIAGNOSIS — Z1231 Encounter for screening mammogram for malignant neoplasm of breast: Secondary | ICD-10-CM | POA: Diagnosis present

## 2024-08-12 ENCOUNTER — Ambulatory Visit: Payer: Self-pay | Admitting: Family Medicine

## 2024-08-12 NOTE — Progress Notes (Signed)
 Please call patient. Normal mammogram.  Repeat in 1 year.

## 2024-09-08 ENCOUNTER — Ambulatory Visit: Admitting: Family Medicine

## 2024-09-27 ENCOUNTER — Encounter: Payer: Self-pay | Admitting: Family Medicine

## 2024-09-27 ENCOUNTER — Ambulatory Visit (INDEPENDENT_AMBULATORY_CARE_PROVIDER_SITE_OTHER): Admitting: Family Medicine

## 2024-09-27 VITALS — BP 139/76 | HR 67

## 2024-09-27 DIAGNOSIS — E041 Nontoxic single thyroid nodule: Secondary | ICD-10-CM

## 2024-09-27 DIAGNOSIS — R748 Abnormal levels of other serum enzymes: Secondary | ICD-10-CM

## 2024-09-27 DIAGNOSIS — I10 Essential (primary) hypertension: Secondary | ICD-10-CM

## 2024-09-27 DIAGNOSIS — E785 Hyperlipidemia, unspecified: Secondary | ICD-10-CM | POA: Diagnosis not present

## 2024-09-27 DIAGNOSIS — Z124 Encounter for screening for malignant neoplasm of cervix: Secondary | ICD-10-CM

## 2024-09-27 DIAGNOSIS — R7301 Impaired fasting glucose: Secondary | ICD-10-CM | POA: Diagnosis not present

## 2024-09-27 MED ORDER — LISINOPRIL-HYDROCHLOROTHIAZIDE 20-25 MG PO TABS: 1.0000 | ORAL_TABLET | Freq: Every day | ORAL | 2 refills | Status: AC

## 2024-09-27 NOTE — Assessment & Plan Note (Signed)
 A little high today but didn't take pill this AM.  Essential hypertension - Ensure medication refills are available at [facility].

## 2024-09-27 NOTE — Assessment & Plan Note (Signed)
 Thyroid  nodule on the right side is unchanged. Reports weight loss not attributed to thyroid  changes. - Monitor thyroid  nodule for changes. - Consider imaging if changes are noted in the future.

## 2024-09-27 NOTE — Assessment & Plan Note (Signed)
Due to recheck labs. 

## 2024-09-27 NOTE — Assessment & Plan Note (Signed)
Due to check A1C

## 2024-09-27 NOTE — Progress Notes (Signed)
 Established Patient Office Visit  Subjective  Patient ID: Selena Richard, female    DOB: 1966-10-29  Age: 58 y.o. MRN: [id number]  Chief Complaint  Patient presents with   Hypertension    FOLLOW-UP    HPI  Discussed the use of AI scribe software for clinical note transcription with the patient, who gave verbal consent to proceed.  History of Present Illness Selena Richard is a 58 year old female who presents for a follow-up visit and medication refill.  Medication adherence and tolerance - Sufficient supply of medication for another month; recently refilled - Has not taken medication this morning - Feels well on current medication regimen  Cardiopulmonary symptoms - No recent chest pain, shortness of breath, or unusual symptoms - Lightheadedness and right arm sensation occurred last year, resolved after a few weeks - CT scan and vascular specialist evaluation last year showed no significant findings - Occasional lightheadedness when hungry, not concerning to her  Thyroid  nodule - History of thyroid  nodule on one side - No change in size or symptoms - No discomfort with swallowing or eating - No compressive symptoms in the neck - Ongoing weight loss  Physical activity and lifestyle - Actively engaging in physical activity, aiming for 10,000 steps daily - Routine was disrupted for a couple of weeks due to parents visiting, but has since resumed regular habits   ROS    Objective:     BP 139/76 (BP Location: Right Arm, Patient Position: Sitting, Cuff Size: Normal)   Pulse 67   SpO2 100%     Physical Exam Vitals and nursing note reviewed.  Constitutional:      Appearance: Normal appearance.  HENT:     Head: Normocephalic and atraumatic.  Eyes:     Conjunctiva/sclera: Conjunctivae normal.  Neck:     Comments: Thyroid  a little larger on the right side. Right medial clavicle more prominent as well.  Cardiovascular:     Rate and Rhythm: Normal  rate and regular rhythm.  Pulmonary:     Effort: Pulmonary effort is normal.     Breath sounds: Normal breath sounds.  Skin:    General: Skin is warm and dry.  Neurological:     Mental Status: She is alert.  Psychiatric:        Mood and Affect: Mood normal.      No results found for any visits on 09/27/24.     The 10-year ASCVD risk score (Arnett DK, et al., 2019) is: 4.3%    Assessment & Plan:   Problem List Items Addressed This Visit       Cardiovascular and Mediastinum   HYPERTENSION, BENIGN - Primary   A little high today but didn't take pill this AM.  Essential hypertension - Ensure medication refills are available at [facility].      Relevant Medications   lisinopril -hydrochlorothiazide  (ZESTORETIC ) 20-25 MG tablet   Other Relevant Orders   CMP14+EGFR   Lipid Panel With LDL/HDL Ratio   HgB A1c   CBC     Endocrine   Right thyroid  nodule   Thyroid  nodule on the right side is unchanged. Reports weight loss not attributed to thyroid  changes. - Monitor thyroid  nodule for changes. - Consider imaging if changes are noted in the future.      IFG (impaired fasting glucose)   Due to check A1C      Relevant Orders   CMP14+EGFR   Lipid Panel With LDL/HDL Ratio   HgB A1c  CBC     Other   Hyperlipidemia   Due to recheck labs.       Relevant Medications   lisinopril -hydrochlorothiazide  (ZESTORETIC ) 20-25 MG tablet   Other Relevant Orders   CMP14+EGFR   Lipid Panel With LDL/HDL Ratio   HgB A1c   CBC   Elevated liver enzymes   Relevant Orders   CMP14+EGFR   Lipid Panel With LDL/HDL Ratio   HgB A1c   CBC   Other Visit Diagnoses       Thyroid  nodule       Relevant Orders   US  THYROID      Screening for cervical cancer       Relevant Orders   Ambulatory referral to Obstetrics / Gynecology       Assessment and Plan Assessment & Plan General Health Maintenance Encouraged to consider pneumococcal vaccine. Recent mammogram completed.  Engaging in regular physical activity. - Encourage pneumococcal vaccination. - Discuss benefits of regular physical activity and maintaining a healthy lifestyle.  Encouraged he to consider pneumonia vaccine.    Return in about 6 months (around 03/28/2025) for Hypertension.    Dorothyann Byars, MD

## 2024-09-28 ENCOUNTER — Ambulatory Visit: Payer: Self-pay | Admitting: Family Medicine

## 2024-09-28 LAB — CMP14+EGFR
ALT: 34 IU/L — ABNORMAL HIGH (ref 0–32)
AST: 25 IU/L (ref 0–40)
Albumin: 4.3 g/dL (ref 3.8–4.9)
Alkaline Phosphatase: 96 IU/L (ref 49–135)
BUN/Creatinine Ratio: 29 — ABNORMAL HIGH (ref 9–23)
BUN: 21 mg/dL (ref 6–24)
Bilirubin Total: 0.6 mg/dL (ref 0.0–1.2)
CO2: 22 mmol/L (ref 20–29)
Calcium: 9.6 mg/dL (ref 8.7–10.2)
Chloride: 97 mmol/L (ref 96–106)
Creatinine, Ser: 0.72 mg/dL (ref 0.57–1.00)
Globulin, Total: 2.6 g/dL (ref 1.5–4.5)
Glucose: 80 mg/dL (ref 70–99)
Potassium: 4.3 mmol/L (ref 3.5–5.2)
Sodium: 135 mmol/L (ref 134–144)
Total Protein: 6.9 g/dL (ref 6.0–8.5)
eGFR: 98 mL/min/1.73 (ref >59)

## 2024-09-28 LAB — CBC
Hematocrit: 37.2 % (ref 34.0–46.6)
Hemoglobin: 13.5 g/dL (ref 11.1–15.9)
MCH: 34.9 pg — ABNORMAL HIGH (ref 26.6–33.0)
MCHC: 36.3 g/dL — ABNORMAL HIGH (ref 31.5–35.7)
MCV: 96 fL (ref 79–97)
Platelets: 213 x10E3/uL (ref 150–450)
RBC: 3.87 x10E6/uL (ref 3.77–5.28)
RDW: 12.4 % (ref 11.7–15.4)
WBC: 5.6 x10E3/uL (ref 3.4–10.8)

## 2024-09-28 LAB — LIPID PANEL WITH LDL/HDL RATIO
Cholesterol, Total: 171 mg/dL (ref 100–199)
HDL: 46 mg/dL (ref >39)
LDL Chol Calc (NIH): 105 mg/dL — ABNORMAL HIGH (ref 0–99)
LDL/HDL Ratio: 2.3 ratio (ref 0.0–3.2)
Triglycerides: 108 mg/dL (ref 0–149)
VLDL Cholesterol Cal: 20 mg/dL (ref 5–40)

## 2024-09-28 LAB — HEMOGLOBIN A1C
Est. average glucose Bld gHb Est-mCnc: 94 mg/dL
Hgb A1c MFr Bld: 4.9 % (ref 4.8–5.6)

## 2024-09-28 NOTE — Progress Notes (Signed)
 Hi Tricia, ALT liver enzyme is just borderline elevated but the rest of the liver function looks good we will keep an eye on this and plan to recheck again in 6 months.  LDL cholesterol is up just again borderline but everything else looks good.  Just continue to work on healthy diet and regular exercise.  Hemoglobin is normal no sign of anemia A1c is normal no sign of diabetes.

## 2024-10-08 ENCOUNTER — Ambulatory Visit (INDEPENDENT_AMBULATORY_CARE_PROVIDER_SITE_OTHER)

## 2024-10-08 DIAGNOSIS — E048 Other specified nontoxic goiter: Secondary | ICD-10-CM

## 2024-10-08 DIAGNOSIS — E041 Nontoxic single thyroid nodule: Secondary | ICD-10-CM

## 2024-10-11 NOTE — Progress Notes (Signed)
 HI Selena Richard,  Ultrasound shows stable nodules and no new ones which is great.

## 2025-03-28 ENCOUNTER — Ambulatory Visit: Admitting: Family Medicine
